# Patient Record
Sex: Male | Born: 1945 | Race: White | Hispanic: No | Smoking: Never smoker
Health system: Southern US, Community
[De-identification: ages and names within clinical notes are randomized; demographics above are authoritative.]

## PROBLEM LIST (undated history)

## (undated) DIAGNOSIS — R413 Other amnesia: Secondary | ICD-10-CM

## (undated) DIAGNOSIS — E079 Disorder of thyroid, unspecified: Secondary | ICD-10-CM

## (undated) DIAGNOSIS — Z8673 Personal history of transient ischemic attack (TIA), and cerebral infarction without residual deficits: Secondary | ICD-10-CM

## (undated) DIAGNOSIS — M109 Gout, unspecified: Secondary | ICD-10-CM

## (undated) DIAGNOSIS — E7800 Pure hypercholesterolemia, unspecified: Secondary | ICD-10-CM

## (undated) DIAGNOSIS — I10 Essential (primary) hypertension: Secondary | ICD-10-CM

## (undated) DIAGNOSIS — N401 Enlarged prostate with lower urinary tract symptoms: Secondary | ICD-10-CM

## (undated) DIAGNOSIS — I672 Cerebral atherosclerosis: Secondary | ICD-10-CM

## (undated) DIAGNOSIS — E538 Deficiency of other specified B group vitamins: Secondary | ICD-10-CM

## (undated) DIAGNOSIS — E785 Hyperlipidemia, unspecified: Secondary | ICD-10-CM

## (undated) DIAGNOSIS — E039 Hypothyroidism, unspecified: Secondary | ICD-10-CM

## (undated) HISTORY — DX: Hyperlipidemia, unspecified: E78.5

## (undated) HISTORY — PX: FRACTURE SURGERY: SHX138

## (undated) HISTORY — DX: Pure hypercholesterolemia, unspecified: E78.00

## (undated) HISTORY — DX: Hypothyroidism, unspecified: E03.9

## (undated) HISTORY — PX: ROTATOR CUFF REPAIR: SHX139

## (undated) HISTORY — DX: Cerebral atherosclerosis: I67.2

## (undated) HISTORY — DX: Cerebral atherosclerosis: Z86.73

## (undated) HISTORY — PX: HERNIA REPAIR: SHX51

## (undated) HISTORY — DX: Essential (primary) hypertension: I10

## (undated) HISTORY — DX: Gout, unspecified: M10.9

## (undated) HISTORY — DX: Benign prostatic hyperplasia with lower urinary tract symptoms: N40.1

## (undated) HISTORY — DX: Disorder of thyroid, unspecified: E07.9

## (undated) HISTORY — DX: Other amnesia: R41.3

## (undated) HISTORY — DX: Deficiency of other specified B group vitamins: E53.8

---

## 2002-12-11 ENCOUNTER — Ambulatory Visit (HOSPITAL_BASED_OUTPATIENT_CLINIC_OR_DEPARTMENT_OTHER): Admission: RE | Admit: 2002-12-11 | Discharge: 2002-12-11 | Payer: Self-pay | Admitting: Orthopedic Surgery

## 2011-12-23 ENCOUNTER — Emergency Department: Payer: Self-pay | Admitting: Emergency Medicine

## 2011-12-23 DIAGNOSIS — S2249XA Multiple fractures of ribs, unspecified side, initial encounter for closed fracture: Secondary | ICD-10-CM | POA: Diagnosis not present

## 2011-12-23 DIAGNOSIS — S2220XA Unspecified fracture of sternum, initial encounter for closed fracture: Secondary | ICD-10-CM | POA: Diagnosis not present

## 2011-12-23 LAB — CBC
HCT: 44.7 % (ref 40.0–52.0)
HGB: 15.2 g/dL (ref 13.0–18.0)
MCH: 33.9 pg (ref 26.0–34.0)
MCHC: 34 g/dL (ref 32.0–36.0)
MCV: 100 fL (ref 80–100)
Platelet: 208 10*3/uL (ref 150–440)
RBC: 4.47 10*6/uL (ref 4.40–5.90)

## 2011-12-23 LAB — COMPREHENSIVE METABOLIC PANEL
Alkaline Phosphatase: 88 U/L (ref 50–136)
Anion Gap: 14 (ref 7–16)
BUN: 23 mg/dL — ABNORMAL HIGH (ref 7–18)
Bilirubin,Total: 0.6 mg/dL (ref 0.2–1.0)
Calcium, Total: 8.6 mg/dL (ref 8.5–10.1)
Co2: 20 mmol/L — ABNORMAL LOW (ref 21–32)
EGFR (African American): 59 — ABNORMAL LOW
Glucose: 111 mg/dL — ABNORMAL HIGH (ref 65–99)
SGOT(AST): 69 U/L — ABNORMAL HIGH (ref 15–37)
Sodium: 135 mmol/L — ABNORMAL LOW (ref 136–145)

## 2011-12-23 LAB — ETHANOL
Ethanol %: 0.286 % — ABNORMAL HIGH (ref 0.000–0.080)
Ethanol: 286 mg/dL

## 2012-05-23 DIAGNOSIS — H1010 Acute atopic conjunctivitis, unspecified eye: Secondary | ICD-10-CM | POA: Diagnosis not present

## 2012-08-10 DIAGNOSIS — E039 Hypothyroidism, unspecified: Secondary | ICD-10-CM | POA: Diagnosis not present

## 2012-08-10 DIAGNOSIS — Z79899 Other long term (current) drug therapy: Secondary | ICD-10-CM | POA: Diagnosis not present

## 2012-08-10 DIAGNOSIS — I10 Essential (primary) hypertension: Secondary | ICD-10-CM | POA: Diagnosis not present

## 2012-08-10 DIAGNOSIS — Z136 Encounter for screening for cardiovascular disorders: Secondary | ICD-10-CM | POA: Diagnosis not present

## 2012-08-10 DIAGNOSIS — E7800 Pure hypercholesterolemia, unspecified: Secondary | ICD-10-CM | POA: Diagnosis not present

## 2012-08-30 DIAGNOSIS — J209 Acute bronchitis, unspecified: Secondary | ICD-10-CM | POA: Diagnosis not present

## 2012-09-03 DIAGNOSIS — I10 Essential (primary) hypertension: Secondary | ICD-10-CM | POA: Diagnosis not present

## 2012-09-03 DIAGNOSIS — R5383 Other fatigue: Secondary | ICD-10-CM | POA: Diagnosis not present

## 2012-09-03 DIAGNOSIS — J209 Acute bronchitis, unspecified: Secondary | ICD-10-CM | POA: Diagnosis not present

## 2012-09-03 DIAGNOSIS — E7800 Pure hypercholesterolemia, unspecified: Secondary | ICD-10-CM | POA: Diagnosis not present

## 2012-09-03 DIAGNOSIS — R0602 Shortness of breath: Secondary | ICD-10-CM | POA: Diagnosis not present

## 2012-11-01 DIAGNOSIS — Z0000 Encounter for general adult medical examination without abnormal findings: Secondary | ICD-10-CM | POA: Diagnosis not present

## 2012-11-01 DIAGNOSIS — I889 Nonspecific lymphadenitis, unspecified: Secondary | ICD-10-CM | POA: Diagnosis not present

## 2013-07-30 DIAGNOSIS — E7800 Pure hypercholesterolemia, unspecified: Secondary | ICD-10-CM | POA: Diagnosis not present

## 2013-07-30 DIAGNOSIS — I10 Essential (primary) hypertension: Secondary | ICD-10-CM | POA: Diagnosis not present

## 2013-07-30 DIAGNOSIS — Z79899 Other long term (current) drug therapy: Secondary | ICD-10-CM | POA: Diagnosis not present

## 2013-07-30 DIAGNOSIS — E039 Hypothyroidism, unspecified: Secondary | ICD-10-CM | POA: Diagnosis not present

## 2014-01-31 DIAGNOSIS — R109 Unspecified abdominal pain: Secondary | ICD-10-CM | POA: Diagnosis not present
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
## 2014-10-06 DIAGNOSIS — R4182 Altered mental status, unspecified: Secondary | ICD-10-CM | POA: Diagnosis not present

## 2014-10-06 DIAGNOSIS — R410 Disorientation, unspecified: Secondary | ICD-10-CM | POA: Diagnosis not present

## 2014-10-06 DIAGNOSIS — R0602 Shortness of breath: Secondary | ICD-10-CM | POA: Diagnosis not present

## 2014-10-06 DIAGNOSIS — I10 Essential (primary) hypertension: Secondary | ICD-10-CM | POA: Diagnosis not present

## 2014-10-06 DIAGNOSIS — E780 Pure hypercholesterolemia: Secondary | ICD-10-CM | POA: Diagnosis not present

## 2014-10-06 DIAGNOSIS — I639 Cerebral infarction, unspecified: Secondary | ICD-10-CM | POA: Diagnosis not present

## 2014-10-07 DIAGNOSIS — E780 Pure hypercholesterolemia: Secondary | ICD-10-CM | POA: Diagnosis not present

## 2014-10-07 DIAGNOSIS — Z0000 Encounter for general adult medical examination without abnormal findings: Secondary | ICD-10-CM | POA: Diagnosis not present

## 2014-10-07 DIAGNOSIS — I10 Essential (primary) hypertension: Secondary | ICD-10-CM | POA: Diagnosis not present

## 2014-10-07 DIAGNOSIS — Z79899 Other long term (current) drug therapy: Secondary | ICD-10-CM | POA: Diagnosis not present

## 2014-10-07 DIAGNOSIS — E039 Hypothyroidism, unspecified: Secondary | ICD-10-CM | POA: Diagnosis not present

## 2014-10-07 DIAGNOSIS — I639 Cerebral infarction, unspecified: Secondary | ICD-10-CM | POA: Diagnosis not present

## 2014-10-13 DIAGNOSIS — I639 Cerebral infarction, unspecified: Secondary | ICD-10-CM | POA: Diagnosis not present

## 2014-10-13 DIAGNOSIS — I6523 Occlusion and stenosis of bilateral carotid arteries: Secondary | ICD-10-CM | POA: Diagnosis not present

## 2014-10-15 DIAGNOSIS — I361 Nonrheumatic tricuspid (valve) insufficiency: Secondary | ICD-10-CM | POA: Diagnosis not present

## 2014-10-15 DIAGNOSIS — I639 Cerebral infarction, unspecified: Secondary | ICD-10-CM | POA: Diagnosis not present

## 2014-10-15 DIAGNOSIS — I350 Nonrheumatic aortic (valve) stenosis: Secondary | ICD-10-CM | POA: Diagnosis not present

## 2014-10-17 DIAGNOSIS — R413 Other amnesia: Secondary | ICD-10-CM | POA: Diagnosis not present

## 2014-10-17 DIAGNOSIS — I639 Cerebral infarction, unspecified: Secondary | ICD-10-CM | POA: Diagnosis not present

## 2014-10-17 DIAGNOSIS — E039 Hypothyroidism, unspecified: Secondary | ICD-10-CM | POA: Diagnosis not present

## 2014-10-17 DIAGNOSIS — E780 Pure hypercholesterolemia: Secondary | ICD-10-CM | POA: Diagnosis not present

## 2014-10-17 DIAGNOSIS — Z23 Encounter for immunization: Secondary | ICD-10-CM | POA: Diagnosis not present

## 2014-11-07 DIAGNOSIS — I69390 Apraxia following cerebral infarction: Secondary | ICD-10-CM | POA: Diagnosis not present

## 2014-11-07 DIAGNOSIS — R5381 Other malaise: Secondary | ICD-10-CM | POA: Diagnosis not present

## 2014-11-07 DIAGNOSIS — M6281 Muscle weakness (generalized): Secondary | ICD-10-CM | POA: Diagnosis not present

## 2014-11-07 DIAGNOSIS — I69393 Ataxia following cerebral infarction: Secondary | ICD-10-CM | POA: Diagnosis not present

## 2014-11-07 DIAGNOSIS — R5383 Other fatigue: Secondary | ICD-10-CM | POA: Diagnosis not present

## 2014-11-12 DIAGNOSIS — M6281 Muscle weakness (generalized): Secondary | ICD-10-CM | POA: Diagnosis not present

## 2014-11-12 DIAGNOSIS — R5383 Other fatigue: Secondary | ICD-10-CM | POA: Diagnosis not present

## 2014-11-12 DIAGNOSIS — I69393 Ataxia following cerebral infarction: Secondary | ICD-10-CM | POA: Diagnosis not present

## 2014-11-12 DIAGNOSIS — R5381 Other malaise: Secondary | ICD-10-CM | POA: Diagnosis not present

## 2014-11-12 DIAGNOSIS — I69390 Apraxia following cerebral infarction: Secondary | ICD-10-CM | POA: Diagnosis not present

## 2015-01-22 DIAGNOSIS — I639 Cerebral infarction, unspecified: Secondary | ICD-10-CM

## 2015-01-22 HISTORY — DX: Cerebral infarction, unspecified: I63.9

## 2015-03-26 DIAGNOSIS — I639 Cerebral infarction, unspecified: Secondary | ICD-10-CM | POA: Diagnosis not present

## 2015-03-26 DIAGNOSIS — R2681 Unsteadiness on feet: Secondary | ICD-10-CM | POA: Diagnosis not present

## 2015-04-02 DIAGNOSIS — I639 Cerebral infarction, unspecified: Secondary | ICD-10-CM | POA: Diagnosis not present

## 2015-04-14 DIAGNOSIS — I6340 Cerebral infarction due to embolism of unspecified cerebral artery: Secondary | ICD-10-CM | POA: Diagnosis not present

## 2015-04-14 DIAGNOSIS — H6122 Impacted cerumen, left ear: Secondary | ICD-10-CM | POA: Diagnosis not present

## 2015-04-14 DIAGNOSIS — Z23 Encounter for immunization: Secondary | ICD-10-CM | POA: Diagnosis not present

## 2015-04-14 DIAGNOSIS — I10 Essential (primary) hypertension: Secondary | ICD-10-CM | POA: Diagnosis not present

## 2015-10-26 DIAGNOSIS — I6340 Cerebral infarction due to embolism of unspecified cerebral artery: Secondary | ICD-10-CM | POA: Diagnosis not present

## 2015-10-26 DIAGNOSIS — Z79899 Other long term (current) drug therapy: Secondary | ICD-10-CM | POA: Diagnosis not present

## 2015-10-26 DIAGNOSIS — R413 Other amnesia: Secondary | ICD-10-CM | POA: Diagnosis not present

## 2015-10-26 DIAGNOSIS — I10 Essential (primary) hypertension: Secondary | ICD-10-CM | POA: Diagnosis not present

## 2015-10-26 DIAGNOSIS — E039 Hypothyroidism, unspecified: Secondary | ICD-10-CM | POA: Diagnosis not present

## 2015-10-26 DIAGNOSIS — E785 Hyperlipidemia, unspecified: Secondary | ICD-10-CM | POA: Diagnosis not present

## 2016-03-21 DIAGNOSIS — I6340 Cerebral infarction due to embolism of unspecified cerebral artery: Secondary | ICD-10-CM | POA: Diagnosis not present

## 2016-03-21 DIAGNOSIS — E039 Hypothyroidism, unspecified: Secondary | ICD-10-CM | POA: Diagnosis not present

## 2016-03-21 DIAGNOSIS — E785 Hyperlipidemia, unspecified: Secondary | ICD-10-CM | POA: Diagnosis not present

## 2016-03-21 DIAGNOSIS — Z0000 Encounter for general adult medical examination without abnormal findings: Secondary | ICD-10-CM | POA: Diagnosis not present

## 2016-03-21 DIAGNOSIS — Z79899 Other long term (current) drug therapy: Secondary | ICD-10-CM | POA: Diagnosis not present

## 2016-03-21 DIAGNOSIS — I10 Essential (primary) hypertension: Secondary | ICD-10-CM | POA: Diagnosis not present

## 2016-03-28 DIAGNOSIS — D510 Vitamin B12 deficiency anemia due to intrinsic factor deficiency: Secondary | ICD-10-CM | POA: Diagnosis not present

## 2016-04-07 DIAGNOSIS — D519 Vitamin B12 deficiency anemia, unspecified: Secondary | ICD-10-CM | POA: Diagnosis not present

## 2016-04-15 DIAGNOSIS — E538 Deficiency of other specified B group vitamins: Secondary | ICD-10-CM | POA: Diagnosis not present

## 2016-04-21 DIAGNOSIS — E538 Deficiency of other specified B group vitamins: Secondary | ICD-10-CM | POA: Diagnosis not present

## 2016-11-18 DIAGNOSIS — Z6828 Body mass index (BMI) 28.0-28.9, adult: Secondary | ICD-10-CM | POA: Diagnosis not present

## 2016-11-18 DIAGNOSIS — E538 Deficiency of other specified B group vitamins: Secondary | ICD-10-CM | POA: Diagnosis not present

## 2016-11-18 DIAGNOSIS — E039 Hypothyroidism, unspecified: Secondary | ICD-10-CM | POA: Diagnosis not present

## 2016-11-18 DIAGNOSIS — Z79899 Other long term (current) drug therapy: Secondary | ICD-10-CM | POA: Diagnosis not present

## 2016-11-25 DIAGNOSIS — D510 Vitamin B12 deficiency anemia due to intrinsic factor deficiency: Secondary | ICD-10-CM | POA: Diagnosis not present

## 2016-12-07 DIAGNOSIS — D510 Vitamin B12 deficiency anemia due to intrinsic factor deficiency: Secondary | ICD-10-CM | POA: Diagnosis not present

## 2016-12-26 DIAGNOSIS — D510 Vitamin B12 deficiency anemia due to intrinsic factor deficiency: Secondary | ICD-10-CM | POA: Diagnosis not present

## 2017-01-05 DIAGNOSIS — D510 Vitamin B12 deficiency anemia due to intrinsic factor deficiency: Secondary | ICD-10-CM | POA: Diagnosis not present

## 2017-01-17 DIAGNOSIS — D510 Vitamin B12 deficiency anemia due to intrinsic factor deficiency: Secondary | ICD-10-CM | POA: Diagnosis not present

## 2017-01-27 DIAGNOSIS — E538 Deficiency of other specified B group vitamins: Secondary | ICD-10-CM | POA: Diagnosis not present

## 2017-02-03 DIAGNOSIS — D510 Vitamin B12 deficiency anemia due to intrinsic factor deficiency: Secondary | ICD-10-CM | POA: Diagnosis not present

## 2017-03-02 DIAGNOSIS — E538 Deficiency of other specified B group vitamins: Secondary | ICD-10-CM | POA: Diagnosis not present

## 2017-03-15 DIAGNOSIS — R0602 Shortness of breath: Secondary | ICD-10-CM | POA: Diagnosis not present

## 2017-03-15 DIAGNOSIS — E538 Deficiency of other specified B group vitamins: Secondary | ICD-10-CM | POA: Diagnosis not present

## 2017-03-15 DIAGNOSIS — Z6828 Body mass index (BMI) 28.0-28.9, adult: Secondary | ICD-10-CM | POA: Diagnosis not present

## 2017-07-24 DIAGNOSIS — Z0000 Encounter for general adult medical examination without abnormal findings: Secondary | ICD-10-CM | POA: Diagnosis not present

## 2017-07-24 DIAGNOSIS — Z23 Encounter for immunization: Secondary | ICD-10-CM | POA: Diagnosis not present

## 2017-07-24 DIAGNOSIS — E538 Deficiency of other specified B group vitamins: Secondary | ICD-10-CM | POA: Diagnosis not present

## 2017-07-24 DIAGNOSIS — E785 Hyperlipidemia, unspecified: Secondary | ICD-10-CM | POA: Diagnosis not present

## 2017-07-24 DIAGNOSIS — Z79899 Other long term (current) drug therapy: Secondary | ICD-10-CM | POA: Diagnosis not present

## 2017-07-24 DIAGNOSIS — E039 Hypothyroidism, unspecified: Secondary | ICD-10-CM | POA: Diagnosis not present

## 2017-07-24 DIAGNOSIS — I10 Essential (primary) hypertension: Secondary | ICD-10-CM | POA: Diagnosis not present

## 2017-07-24 DIAGNOSIS — M545 Low back pain: Secondary | ICD-10-CM | POA: Diagnosis not present

## 2017-08-11 DIAGNOSIS — E538 Deficiency of other specified B group vitamins: Secondary | ICD-10-CM | POA: Diagnosis not present

## 2017-08-22 DIAGNOSIS — D510 Vitamin B12 deficiency anemia due to intrinsic factor deficiency: Secondary | ICD-10-CM | POA: Diagnosis not present

## 2017-09-07 DIAGNOSIS — D510 Vitamin B12 deficiency anemia due to intrinsic factor deficiency: Secondary | ICD-10-CM | POA: Diagnosis not present

## 2017-09-19 DIAGNOSIS — D510 Vitamin B12 deficiency anemia due to intrinsic factor deficiency: Secondary | ICD-10-CM | POA: Diagnosis not present

## 2017-10-11 DIAGNOSIS — E538 Deficiency of other specified B group vitamins: Secondary | ICD-10-CM | POA: Diagnosis not present

## 2018-04-27 DIAGNOSIS — I672 Cerebral atherosclerosis: Secondary | ICD-10-CM | POA: Diagnosis not present

## 2018-04-27 DIAGNOSIS — I10 Essential (primary) hypertension: Secondary | ICD-10-CM | POA: Diagnosis not present

## 2018-04-27 DIAGNOSIS — R5381 Other malaise: Secondary | ICD-10-CM | POA: Diagnosis not present

## 2018-04-27 DIAGNOSIS — Z23 Encounter for immunization: Secondary | ICD-10-CM | POA: Diagnosis not present

## 2018-04-27 DIAGNOSIS — R413 Other amnesia: Secondary | ICD-10-CM | POA: Diagnosis not present

## 2018-09-06 DIAGNOSIS — E7800 Pure hypercholesterolemia, unspecified: Secondary | ICD-10-CM | POA: Diagnosis not present

## 2018-09-06 DIAGNOSIS — Z23 Encounter for immunization: Secondary | ICD-10-CM | POA: Diagnosis not present

## 2018-09-06 DIAGNOSIS — E039 Hypothyroidism, unspecified: Secondary | ICD-10-CM | POA: Diagnosis not present

## 2018-09-06 DIAGNOSIS — Z79899 Other long term (current) drug therapy: Secondary | ICD-10-CM | POA: Diagnosis not present

## 2018-09-06 DIAGNOSIS — M109 Gout, unspecified: Secondary | ICD-10-CM | POA: Diagnosis not present

## 2018-09-06 DIAGNOSIS — E785 Hyperlipidemia, unspecified: Secondary | ICD-10-CM | POA: Diagnosis not present

## 2018-09-06 DIAGNOSIS — Z0000 Encounter for general adult medical examination without abnormal findings: Secondary | ICD-10-CM | POA: Diagnosis not present

## 2018-09-06 DIAGNOSIS — I10 Essential (primary) hypertension: Secondary | ICD-10-CM | POA: Diagnosis not present

## 2019-03-07 DIAGNOSIS — Z6829 Body mass index (BMI) 29.0-29.9, adult: Secondary | ICD-10-CM | POA: Diagnosis not present

## 2019-03-07 DIAGNOSIS — H6120 Impacted cerumen, unspecified ear: Secondary | ICD-10-CM | POA: Diagnosis not present

## 2019-03-07 DIAGNOSIS — H6092 Unspecified otitis externa, left ear: Secondary | ICD-10-CM | POA: Diagnosis not present

## 2019-10-08 DIAGNOSIS — Z0000 Encounter for general adult medical examination without abnormal findings: Secondary | ICD-10-CM | POA: Diagnosis not present

## 2019-10-08 DIAGNOSIS — I10 Essential (primary) hypertension: Secondary | ICD-10-CM | POA: Diagnosis not present

## 2019-10-08 DIAGNOSIS — E039 Hypothyroidism, unspecified: Secondary | ICD-10-CM | POA: Diagnosis not present

## 2019-10-08 DIAGNOSIS — Z79899 Other long term (current) drug therapy: Secondary | ICD-10-CM | POA: Diagnosis not present

## 2019-10-08 DIAGNOSIS — E7800 Pure hypercholesterolemia, unspecified: Secondary | ICD-10-CM | POA: Diagnosis not present

## 2019-10-08 DIAGNOSIS — E538 Deficiency of other specified B group vitamins: Secondary | ICD-10-CM | POA: Diagnosis not present

## 2020-05-21 DIAGNOSIS — Z131 Encounter for screening for diabetes mellitus: Secondary | ICD-10-CM | POA: Diagnosis not present

## 2020-05-21 DIAGNOSIS — N401 Enlarged prostate with lower urinary tract symptoms: Secondary | ICD-10-CM | POA: Diagnosis not present

## 2020-05-21 DIAGNOSIS — Z79899 Other long term (current) drug therapy: Secondary | ICD-10-CM | POA: Diagnosis not present

## 2020-05-21 DIAGNOSIS — N481 Balanitis: Secondary | ICD-10-CM | POA: Diagnosis not present

## 2020-07-17 DIAGNOSIS — Z6827 Body mass index (BMI) 27.0-27.9, adult: Secondary | ICD-10-CM | POA: Diagnosis not present

## 2020-07-17 DIAGNOSIS — N401 Enlarged prostate with lower urinary tract symptoms: Secondary | ICD-10-CM | POA: Diagnosis not present

## 2020-07-17 DIAGNOSIS — Z23 Encounter for immunization: Secondary | ICD-10-CM | POA: Diagnosis not present

## 2020-10-07 DIAGNOSIS — I10 Essential (primary) hypertension: Secondary | ICD-10-CM | POA: Diagnosis not present

## 2020-10-07 DIAGNOSIS — E039 Hypothyroidism, unspecified: Secondary | ICD-10-CM | POA: Diagnosis not present

## 2020-10-07 DIAGNOSIS — E559 Vitamin D deficiency, unspecified: Secondary | ICD-10-CM | POA: Diagnosis not present

## 2020-10-09 DIAGNOSIS — I10 Essential (primary) hypertension: Secondary | ICD-10-CM | POA: Diagnosis not present

## 2020-10-09 DIAGNOSIS — R0600 Dyspnea, unspecified: Secondary | ICD-10-CM | POA: Diagnosis not present

## 2020-10-09 DIAGNOSIS — E7800 Pure hypercholesterolemia, unspecified: Secondary | ICD-10-CM | POA: Diagnosis not present

## 2020-10-09 DIAGNOSIS — E039 Hypothyroidism, unspecified: Secondary | ICD-10-CM | POA: Diagnosis not present

## 2020-11-07 DIAGNOSIS — I10 Essential (primary) hypertension: Secondary | ICD-10-CM | POA: Diagnosis not present

## 2020-11-07 DIAGNOSIS — E039 Hypothyroidism, unspecified: Secondary | ICD-10-CM | POA: Diagnosis not present

## 2020-12-03 ENCOUNTER — Encounter: Payer: Self-pay | Admitting: Cardiology

## 2020-12-03 ENCOUNTER — Encounter: Payer: Self-pay | Admitting: *Deleted

## 2020-12-07 DIAGNOSIS — E7800 Pure hypercholesterolemia, unspecified: Secondary | ICD-10-CM | POA: Diagnosis not present

## 2020-12-07 DIAGNOSIS — R413 Other amnesia: Secondary | ICD-10-CM | POA: Diagnosis not present

## 2021-01-01 DIAGNOSIS — I639 Cerebral infarction, unspecified: Secondary | ICD-10-CM | POA: Diagnosis not present

## 2021-01-01 DIAGNOSIS — Z6826 Body mass index (BMI) 26.0-26.9, adult: Secondary | ICD-10-CM | POA: Diagnosis not present

## 2021-01-01 DIAGNOSIS — S161XXA Strain of muscle, fascia and tendon at neck level, initial encounter: Secondary | ICD-10-CM | POA: Diagnosis not present

## 2021-01-01 DIAGNOSIS — F0630 Mood disorder due to known physiological condition, unspecified: Secondary | ICD-10-CM | POA: Diagnosis not present

## 2021-01-06 ENCOUNTER — Encounter: Payer: Self-pay | Admitting: Cardiology

## 2021-01-06 ENCOUNTER — Other Ambulatory Visit: Payer: Self-pay

## 2021-01-06 ENCOUNTER — Ambulatory Visit: Payer: Medicare Other | Admitting: Cardiology

## 2021-01-06 VITALS — BP 126/70 | HR 74 | Ht 65.0 in | Wt 160.0 lb

## 2021-01-06 DIAGNOSIS — Z8673 Personal history of transient ischemic attack (TIA), and cerebral infarction without residual deficits: Secondary | ICD-10-CM

## 2021-01-06 DIAGNOSIS — R0600 Dyspnea, unspecified: Secondary | ICD-10-CM

## 2021-01-06 DIAGNOSIS — E782 Mixed hyperlipidemia: Secondary | ICD-10-CM

## 2021-01-06 DIAGNOSIS — I10 Essential (primary) hypertension: Secondary | ICD-10-CM | POA: Insufficient documentation

## 2021-01-06 DIAGNOSIS — R0609 Other forms of dyspnea: Secondary | ICD-10-CM | POA: Insufficient documentation

## 2021-01-06 NOTE — Progress Notes (Signed)
Cardiology Office Note:    Date:  01/06/2021   ID:  Andrew Cain, DOB [id number], MRN [id number]  PCP:  Mateo Flow, MD  Cardiologist:  Jenean Lindau, MD   Referring MD: Mateo Flow, MD    ASSESSMENT:    1. DOE (dyspnea on exertion)   2. Essential hypertension   3. Mixed dyslipidemia   4. History of stroke    PLAN:    In order of problems listed above:  Primary prevention stressed with the patient.  Importance of compliance with diet medication stressed any vocalized understanding.  I told him to be ambulating to the best of his ability. Dyspnea on exertion: Cardiovascular evaluation needs to be done.  He is fairly sedentary because of stroke and we will do a Lexiscan sestamibi to see if there is any coronary etiology for the symptoms.  Echocardiogram will be done to assess murmur heard on auscultation. Cardiac murmur: Echocardiogram will be done to assess murmur heard on auscultation. Mixed dyslipidemia: Lipids reviewed.  Diet advised.  Lipids followed by primary care. Essential hypertension: Blood pressure stable and diet was emphasized. Patient will be seen in follow-up appointment in 6 months or earlier if the patient has any concerns    Medication Adjustments/Labs and Tests Ordered: Current medicines are reviewed at length with the patient today.  Concerns regarding medicines are outlined above.  Orders Placed This Encounter  Procedures   MYOCARDIAL PERFUSION IMAGING   EKG 12-Lead   ECHOCARDIOGRAM COMPLETE   No orders of the defined types were placed in this encounter.    History of Present Illness:    Andrew Cain is a 75 y.o. male who is being seen today for the evaluation of dyspnea on exertion at the request of Mateo Flow, MD. patient is a pleasant 75 year old male.  He has past medical history of essential hypertension dyslipidemia and history of stroke.  He mentioned to his doctor that he has dyspnea on exertion and wanted a cardiac evaluation.   Since stroke he has had a sedentary lifestyle.  No chest pain orthopnea or PND.  At the time of my evaluation, the patient is alert awake oriented and in no distress.  His daughter accompanies him for this visit and tells me that she feels her dad also has some short-term memory issues.  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   amLODipine (NORVASC) 5 MG tablet Take 5 mg by mouth daily.   aspirin EC 325 MG tablet Take 325 mg by mouth daily.   atorvastatin (LIPITOR) 20 MG tablet Take 20 mg by mouth daily.   cyanocobalamin (,VITAMIN B-12,) 1000 MCG/ML injection Inject 1,000 mcg into the muscle every 30 (thirty) days.   cyclobenzaprine (FLEXERIL) 5 MG tablet Take 5 mg by mouth  2 (two) times daily.   finasteride (PROSCAR) 5 MG tablet Take 5 mg by mouth daily.   FLUoxetine (PROZAC) 20 MG capsule Take 20 mg by mouth daily.   levothyroxine (SYNTHROID) 125 MCG tablet Take 125 mcg by mouth daily before breakfast.   losartan (COZAAR) 50 MG tablet Take 50 mg by mouth daily.   tamsulosin (FLOMAX) 0.4 MG CAPS capsule Take 0.4 mg by mouth at bedtime.     Allergies:   Patient has no known allergies.   Social History   Socioeconomic History   Marital status: Divorced    Spouse name: Not on file   Number of children: Not on file   Years of education: Not on file   Highest education level:  Not on file  Occupational History   Not on file  Tobacco Use   Smoking status: Never   Smokeless tobacco: Never  Substance and Sexual Activity   Alcohol use: Never   Drug use: Never   Sexual activity: Not on file  Other Topics Concern   Not on file  Social History Narrative   Not on file   Social Determinants of Health   Financial Resource Strain: Not on file  Food Insecurity: Not on file  Transportation Needs: Not on file  Physical Activity: Not on file  Stress: Not on file  Social Connections: Not on file     Family History: The patient's family history includes Aneurysm in his father; Hypercholesterolemia in his sister; Hypertension in his sister; Liver disease in his son; Other in his sister; Prostate cancer in his brother and brother; Renal Disease in his brother.  ROS:   Please see the history of present illness.    All other systems reviewed and are negative.  EKGs/Labs/Other Studies Reviewed:    The following studies were reviewed today: EKG reveals sinus rhythm and nonspecific ST-T changes   Recent Labs: No results found for requested labs within last 8760 hours.  Recent Lipid Panel No results found for: CHOL, TRIG, HDL, CHOLHDL, VLDL, LDLCALC, LDLDIRECT  Physical Exam:    VS:  BP 126/70   Pulse 74   Ht 5' 5" (1.651 m)   Wt 160 lb (72.6 kg)   SpO2 94%   BMI 26.63 kg/m     Wt Readings from Last 3 Encounters:  01/06/21 160 lb (72.6 kg)  10/09/20 163 lb (73.9 kg)     GEN: Patient is in no acute distress HEENT: Normal NECK: No JVD; No carotid bruits LYMPHATICS: No lymphadenopathy CARDIAC: S1 S2 regular, 2/6 systolic murmur at the apex. RESPIRATORY:  Clear to auscultation without rales, wheezing or rhonchi  ABDOMEN: Soft, non-tender, non-distended MUSCULOSKELETAL:  No edema; No deformity  SKIN: Warm and dry NEUROLOGIC:  Alert and oriented x 3 PSYCHIATRIC:  Normal affect    Signed, Jenean Lindau, MD  01/06/2021 11:12 AM    [place]

## 2021-01-06 NOTE — Patient Instructions (Signed)
Medication Instructions:  No medication changes. *If you need a refill on your cardiac medications before your next appointment, please call your pharmacy*   Lab Work: None ordered If you have labs (blood work) drawn today and your tests are completely normal, you will receive your results only by: [place] (if you have MyChart) OR A paper copy in the mail If you have any lab test that is abnormal or we need to change your treatment, we will call you to review the results.   Testing/Procedures: Your physician has requested that you have a lexiscan myoview. For further information please visit [email or web address]. Please follow instruction sheet, as given.  The test will take approximately 3 to 4 hours to complete; you may bring reading material.  If someone comes with you to your appointment, they will need to remain in the main lobby due to limited space in the testing area.   How to prepare for your Myocardial Perfusion Test: Do not eat or drink 3 hours prior to your test, except you may have water. Do not consume products containing caffeine (regular or decaffeinated) 12 hours prior to your test. (ex: coffee, chocolate, sodas, tea). Do bring a list of your current medications with you.  If not listed below, you may take your medications as normal. Do wear comfortable clothes (no dresses or overalls) and walking shoes, tennis shoes preferred (No heels or open toe shoes are allowed). Do NOT wear cologne, perfume, aftershave, or lotions (deodorant is allowed). If these instructions are not followed, your test will have to be rescheduled.  Your physician has requested that you have an echocardiogram. Echocardiography is a painless test that uses sound waves to create images of your heart. It provides your doctor with information about the size and shape of your heart and how well your heart's chambers and valves are working. This procedure takes approximately one hour. There are no  restrictions for this procedure.   Follow-Up: At [facility], you and your health needs are our priority.  As part of our continuing mission to provide you with exceptional heart care, we have created designated Provider Care Teams.  These Care Teams include your primary Cardiologist (physician) and Advanced Practice Providers (APPs -  Physician Assistants and Nurse Practitioners) who all work together to provide you with the care you need, when you need it.  We recommend signing up for the patient portal called "MyChart".  Sign up information is provided on this After Visit Summary.  MyChart is used to connect with patients for Virtual Visits (Telemedicine).  Patients are able to view lab/test results, encounter notes, upcoming appointments, etc.  Non-urgent messages can be sent to your provider as well.   To learn more about what you can do with MyChart, go to [email or web address].    Your next appointment:   6 month(s)  The format for your next appointment:   In Person  Provider:   Jyl Heinz, MD   Other Instructions Cardiac Nuclear Scan A cardiac nuclear scan is a test that is done to check the flow of blood to your heart. It is done when you are resting and when you are exercising. The test looks for problems such as: Not enough blood reaching a portion of the heart. The heart muscle not working as it should. You may need this test if: You have heart disease. You have had lab results that are not normal. You have had heart surgery or a balloon procedure to open up blocked arteries (  angioplasty). You have chest pain. You have shortness of breath. In this test, a special dye (tracer) is put into your bloodstream. The tracer will travel to your heart. A camera will then take pictures of your heart to see how the tracer moves through your heart. This test is usually done at a hospital and takes 2-4 hours. Tell a doctor about: Any allergies you have. All medicines you are  taking, including vitamins, herbs, eye drops, creams, and over-the-counter medicines. Any problems you or family members have had with anesthetic medicines. Any blood disorders you have. Any surgeries you have had. Any medical conditions you have. Whether you are pregnant or may be pregnant. What are the risks? Generally, this is a safe test. However, problems may occur, such as: Serious chest pain and heart attack. This is only a risk if the stress portion of the test is done. Rapid heartbeat. A feeling of warmth in your chest. This feeling usually does not last long. Allergic reaction to the tracer. What happens before the test? Ask your doctor about changing or stopping your normal medicines. This is important. Follow instructions from your doctor about what you cannot eat or drink. Remove your jewelry on the day of the test. What happens during the test? An IV tube will be inserted into one of your veins. Your doctor will give you a small amount of tracer through the IV tube. You will wait for 20-40 minutes while the tracer moves through your bloodstream. Your heart will be monitored with an electrocardiogram (ECG). You will lie down on an exam table. Pictures of your heart will be taken for about 15-20 minutes. You may also have a stress test. For this test, one of these things may be done: You will be asked to exercise on a treadmill or a stationary bike. You will be given medicines that will make your heart work harder. This is done if you are unable to exercise. When blood flow to your heart has peaked, a tracer will again be given through the IV tube. After 20-40 minutes, you will get back on the exam table. More pictures will be taken of your heart. Depending on the tracer that is used, more pictures may need to be taken 3-4 hours later. Your IV tube will be removed when the test is over. The test may vary among doctors and hospitals. What happens after the test? Ask your  doctor: Whether you can return to your normal schedule, including diet, activities, and medicines. Whether you should drink more fluids. This will help to remove the tracer from your body. Drink enough fluid to keep your pee (urine) pale yellow. Ask your doctor, or the department that is doing the test: When will my results be ready? How will I get my results? Summary A cardiac nuclear scan is a test that is done to check the flow of blood to your heart. Tell your doctor whether you are pregnant or may be pregnant. Before the test, ask your doctor about changing or stopping your normal medicines. This is important. Ask your doctor whether you can return to your normal activities. You may be asked to drink more fluids. This information is not intended to replace advice given to you by your health care provider. Make sure you discuss any questions you have with your health care provider. Document Revised: 10/17/2018 Document Reviewed: 12/11/2017 Elsevier Patient Education  2021 [employer].    Echocardiogram An echocardiogram is a test that uses sound waves (ultrasound)  to produce images of the heart. Images from an echocardiogram can provide important information about: Heart size and shape. The size and thickness and movement of your heart's walls. Heart muscle function and strength. Heart valve function or if you have stenosis. Stenosis is when the heart valves are too narrow. If blood is flowing backward through the heart valves (regurgitation). A tumor or infectious growth around the heart valves. Areas of heart muscle that are not working well because of poor blood flow or injury from a heart attack. Aneurysm detection. An aneurysm is a weak or damaged part of an artery wall. The wall bulges out from the normal force of blood pumping through the body. Tell a health care provider about: Any allergies you have. All medicines you are taking, including vitamins, herbs, eye drops,  creams, and over-the-counter medicines. Any blood disorders you have. Any surgeries you have had. Any medical conditions you have. Whether you are pregnant or may be pregnant. What are the risks? Generally, this is a safe test. However, problems may occur, including an allergic reaction to dye (contrast) that may be used during the test. What happens before the test? No specific preparation is needed. You may eat and drink normally. What happens during the test? You will take off your clothes from the waist up and put on a hospital gown. Electrodes or electrocardiogram (ECG)patches may be placed on your chest. The electrodes or patches are then connected to a device that monitors your heart rate and rhythm. You will lie down on a table for an ultrasound exam. A gel will be applied to your chest to help sound waves pass through your skin. A handheld device, called a transducer, will be pressed against your chest and moved over your heart. The transducer produces sound waves that travel to your heart and bounce back (or "echo" back) to the transducer. These sound waves will be captured in real-time and changed into images of your heart that can be viewed on a video monitor. The images will be recorded on a computer and reviewed by your health care provider. You may be asked to change positions or hold your breath for a short time. This makes it easier to get different views or better views of your heart. In some cases, you may receive contrast through an IV in one of your veins. This can improve the quality of the pictures from your heart. The procedure may vary among health care providers and hospitals.    What can I expect after the test? You may return to your normal, everyday life, including diet, activities, and medicines, unless your health care provider tells you not to do that. Follow these instructions at home: It is up to you to get the results of your test. Ask your health care  provider, or the department that is doing the test, when your results will be ready. Keep all follow-up visits. This is important. Summary An echocardiogram is a test that uses sound waves (ultrasound) to produce images of the heart. Images from an echocardiogram can provide important information about the size and shape of your heart, heart muscle function, heart valve function, and other possible heart problems. You do not need to do anything to prepare before this test. You may eat and drink normally. After the echocardiogram is completed, you may return to your normal, everyday life, unless your health care provider tells you not to do that. This information is not intended to replace advice given to you by  your health care provider. Make sure you discuss any questions you have with your health care provider. Document Revised: 02/18/2020 Document Reviewed: 02/18/2020 Elsevier Patient Education  2021 [employer].

## 2021-01-07 DIAGNOSIS — I10 Essential (primary) hypertension: Secondary | ICD-10-CM | POA: Diagnosis not present

## 2021-01-07 DIAGNOSIS — E539 Vitamin B deficiency, unspecified: Secondary | ICD-10-CM | POA: Diagnosis not present

## 2021-01-07 DIAGNOSIS — E039 Hypothyroidism, unspecified: Secondary | ICD-10-CM | POA: Diagnosis not present

## 2021-01-21 ENCOUNTER — Telehealth: Payer: Self-pay

## 2021-01-21 NOTE — Telephone Encounter (Signed)
Spoke with the patient's daughter, detailed instructions given. She will be bringing him. Asked to call back with any questions. S.Bryony Kaman EMTP

## 2021-01-26 ENCOUNTER — Ambulatory Visit (INDEPENDENT_AMBULATORY_CARE_PROVIDER_SITE_OTHER): Payer: Medicare Other

## 2021-01-26 ENCOUNTER — Other Ambulatory Visit: Payer: Self-pay

## 2021-01-26 DIAGNOSIS — R0609 Other forms of dyspnea: Secondary | ICD-10-CM

## 2021-01-26 DIAGNOSIS — R0600 Dyspnea, unspecified: Secondary | ICD-10-CM

## 2021-01-26 LAB — ECHOCARDIOGRAM COMPLETE
Area-P 1/2: 3.63 cm2
S' Lateral: 2.6 cm

## 2021-01-26 MED ORDER — REGADENOSON 0.4 MG/5ML IV SOLN
0.4000 mg | Freq: Once | INTRAVENOUS | Status: AC
  Administered 2021-01-26: 0.4 mg via INTRAVENOUS

## 2021-01-26 MED ORDER — TECHNETIUM TC 99M TETROFOSMIN IV KIT
11.0000 | PACK | Freq: Once | INTRAVENOUS | Status: AC | PRN
  Administered 2021-01-26: 11 via INTRAVENOUS

## 2021-01-26 MED ORDER — TECHNETIUM TC 99M TETROFOSMIN IV KIT
32.3000 | PACK | Freq: Once | INTRAVENOUS | Status: AC | PRN
  Administered 2021-01-26: 32.3 via INTRAVENOUS

## 2021-01-27 LAB — MYOCARDIAL PERFUSION IMAGING
LV dias vol: 68 mL (ref 62–150)
LV sys vol: 23 mL
Peak HR: 93 {beats}/min
Rest HR: 77 {beats}/min
SDS: 5
SRS: 0
SSS: 5
TID: 1.2

## 2021-02-16 DIAGNOSIS — Z20828 Contact with and (suspected) exposure to other viral communicable diseases: Secondary | ICD-10-CM | POA: Diagnosis not present

## 2021-02-16 DIAGNOSIS — J22 Unspecified acute lower respiratory infection: Secondary | ICD-10-CM | POA: Diagnosis not present

## 2021-02-16 DIAGNOSIS — R0602 Shortness of breath: Secondary | ICD-10-CM | POA: Diagnosis not present

## 2021-02-16 DIAGNOSIS — R059 Cough, unspecified: Secondary | ICD-10-CM | POA: Diagnosis not present

## 2021-03-04 DIAGNOSIS — Z6826 Body mass index (BMI) 26.0-26.9, adult: Secondary | ICD-10-CM | POA: Diagnosis not present

## 2021-03-04 DIAGNOSIS — R0902 Hypoxemia: Secondary | ICD-10-CM | POA: Diagnosis not present

## 2021-03-04 DIAGNOSIS — Z1331 Encounter for screening for depression: Secondary | ICD-10-CM | POA: Diagnosis not present

## 2021-03-04 DIAGNOSIS — J849 Interstitial pulmonary disease, unspecified: Secondary | ICD-10-CM | POA: Diagnosis not present

## 2021-03-05 DIAGNOSIS — J849 Interstitial pulmonary disease, unspecified: Secondary | ICD-10-CM | POA: Diagnosis not present

## 2021-03-08 DIAGNOSIS — J849 Interstitial pulmonary disease, unspecified: Secondary | ICD-10-CM | POA: Diagnosis not present

## 2021-03-17 DIAGNOSIS — J849 Interstitial pulmonary disease, unspecified: Secondary | ICD-10-CM | POA: Diagnosis not present

## 2021-03-17 DIAGNOSIS — I951 Orthostatic hypotension: Secondary | ICD-10-CM | POA: Diagnosis not present

## 2021-04-05 DIAGNOSIS — J432 Centrilobular emphysema: Secondary | ICD-10-CM | POA: Diagnosis not present

## 2021-04-05 DIAGNOSIS — J479 Bronchiectasis, uncomplicated: Secondary | ICD-10-CM | POA: Diagnosis not present

## 2021-04-05 DIAGNOSIS — I700 Atherosclerosis of aorta: Secondary | ICD-10-CM | POA: Diagnosis not present

## 2021-04-05 DIAGNOSIS — J439 Emphysema, unspecified: Secondary | ICD-10-CM | POA: Diagnosis not present

## 2021-04-05 DIAGNOSIS — R0902 Hypoxemia: Secondary | ICD-10-CM | POA: Diagnosis not present

## 2021-04-05 DIAGNOSIS — J849 Interstitial pulmonary disease, unspecified: Secondary | ICD-10-CM | POA: Diagnosis not present

## 2021-04-06 ENCOUNTER — Encounter: Payer: Self-pay | Admitting: Internal Medicine

## 2021-04-06 ENCOUNTER — Other Ambulatory Visit: Payer: Self-pay

## 2021-04-06 ENCOUNTER — Ambulatory Visit: Payer: Medicare Other | Admitting: Internal Medicine

## 2021-04-06 VITALS — BP 108/70 | HR 71 | Temp 97.9°F | Ht 65.0 in | Wt 155.4 lb

## 2021-04-06 DIAGNOSIS — R911 Solitary pulmonary nodule: Secondary | ICD-10-CM

## 2021-04-06 DIAGNOSIS — R0600 Dyspnea, unspecified: Secondary | ICD-10-CM

## 2021-04-06 DIAGNOSIS — R0902 Hypoxemia: Secondary | ICD-10-CM | POA: Diagnosis not present

## 2021-04-06 DIAGNOSIS — J849 Interstitial pulmonary disease, unspecified: Secondary | ICD-10-CM

## 2021-04-06 DIAGNOSIS — R0609 Other forms of dyspnea: Secondary | ICD-10-CM

## 2021-04-06 NOTE — Progress Notes (Signed)
OV 04/06/2021  Subjective:  Patient ID: Andrew Cain, male , DOB: 06/01/1946 , age 75 y.o. , MRN: [id number] , ADDRESS: [place] [id number] PCP Andrew Flow, MD Patient Care Team: Andrew Flow, MD as PCP - General (Family Medicine)  This Provider for this visit: Treatment Team:  Attending Provider: Brand Males, MD    04/06/2021 -   Chief Complaint  Patient presents with   Consult    Pt is being referred for ILD evaluation and due to exertional hypoxemia.  Pt was recently diagnosed with pulmonary fibrosis about a couple months ago. Pt has had problems with SOB for years that is worse with exertion.     HPI Andrew Cain 75 y.o. -presents with his daughter Andrew Cain and also one Andrew Cain the outside records his estranged wife died from an MI in 07-01-2020.  Consult as a referral from [facility] family physicians practice Dr. Humphrey Cain and is fine to social lung disease/pulmonary fibrosis.  History is provided by the daughter and also review of the outside records.  He is a lifelong non-smoker.  Some 6 years ago [review the chart indicate it was 2013] he suffered a stroke and since then has been less active.  The daughter started noticing insidious onset of shortness of breath a few years ago.  This was initially put down due to physical deconditioning in the postop setting.  In the last 6 to 8 months its definitely accelerated worsening.  But overall he has been worsening in the last few years.  He is a Designer, jewellery few months ago and was diagnosed with pneumonia with underlying pulmonary fibrosis based on the verbal history.  A follow-up approximately 6 weeks ago showed pulmonary fibrosis on chest x-ray.  He had exertional hypoxemia into the 80s.  He was then started on portable oxygen but advised to use it on 24/7 basis.  The daughter feels that since then he might of had slightly more worsening of shortness of breath.  Class III dyspnea  definitely.  Prior course of steroids did not help this.  Since starting oxygen is also having a postnasal drip and a mild cough.  The cough is definitely new.  Was not there before the oxygen use.  There is no prior history of COVID.  He is a Psychologist, sport and exercise.  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  He is also had increased frequency of falls and this is secondary to antihypertensives but this is improved after one of the hypertensives are all of them were stopped.  He has had a recent cardiac stress test according to the history this was normal.  Outside records show echocardiogram in July 2022: Ejection fraction 60-65%.  Grade 1 diastolic dysfunction normal right ventricle and normal pulmonary artery pressures.  Outside labs indicate normal liver function test, hemoglobin of 14.8 g%, creatinine of 1.1 mg percent in April 2022  His walking desaturation test showed a precipitous decline in pulse ox  Simple office walk 185 feet x  3 laps goal with forehead probe 04/06/2021    O2 used ra   Number laps completed Half of 3   Comments about pace Slow pace and wobbly   Resting Pulse Ox/HR 95% and 77/min   Final Pulse Ox/HR 77% and 108/min   Desaturated </= 88% yes   Desaturated <= 3% points yes   Got Tachycardic >/= 90/min yes   Symptoms  at end of test Dyspnea, half lap wa down to 80s and upon return to room was 77%   Miscellaneous comments Corrected with 2L [place]     High-resolution CT chest done 04/05/2021 and radiology report emailed by the radiologist after the patient left  I looked at Andrew Cain's CT. 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  There are findings of pulmonary fibrosis and emphysema with some subpleural cysts in the upper lungs which are more thick walled, which I think is due to paraseptal emphysema with associated with fibrotic changes and not definitely honeycomb change. There is some probable early honeycomb change of the right lower lobe.   I also  recommended a six-month follow-up for a small solid pulmonary nodule.  Best, Andrew Cain    has a past medical history of Benign essential hypertension, Benign prostatic hyperplasia with lower urinary tract symptoms, Cerebral arteriosclerosis with history of previous stroke, Cerebrovascular accident (CVA) ([place]) (01/22/2015), Gout, Hyperlipidemia, Hypothyroidism (acquired), Memory disturbance, Pure hypercholesterolemia, Thyroid disorder, and Vitamin B12 deficiency.   reports that he has never smoked. He has never used smokeless tobacco.  Past Surgical History:  Procedure Laterality Date   FRACTURE SURGERY     Arm   HERNIA REPAIR     ROTATOR CUFF REPAIR Left     No Known Allergies  Immunization History  Administered Date(s) Administered   Moderna Sars-Covid-2 Vaccination 09/05/2019, 10/07/2019, 07/17/2020    Family History  Problem Relation Age of Onset   Aneurysm Father        thoracic   Other Sister        Auto accident   Renal Disease Brother    Prostate cancer Brother    Prostate cancer Brother    Hypertension Sister    Hypercholesterolemia Sister    Liver disease Son      Current Outpatient Medications:    allopurinol (ZYLOPRIM) 300 MG tablet, Take 300 mg by mouth daily., Disp: , Rfl:    aspirin EC 325 MG tablet, Take 325 mg by mouth daily., Disp: , Rfl:    atorvastatin (LIPITOR) 20 MG tablet, Take 20 mg by mouth daily., Disp: , Rfl:    cyanocobalamin (,VITAMIN B-12,) 1000 MCG/ML injection, Inject 1,000 mcg into the muscle every 30 (thirty) days., Disp: , Rfl:    finasteride (PROSCAR) 5 MG tablet, Take 5 mg by mouth daily., Disp: , Rfl:    FLUoxetine (PROZAC) 20 MG capsule, Take 20 mg by mouth daily., Disp: , Rfl:    levothyroxine (SYNTHROID) 125 MCG tablet, Take 125 mcg by mouth daily before breakfast., Disp: , Rfl:    midodrine (PROAMATINE) 10 MG tablet, Take by mouth., Disp: , Rfl:    tamsulosin (FLOMAX) 0.4 MG CAPS capsule, Take 0.4 mg by mouth at bedtime., Disp: ,  Rfl:       Objective:   Vitals:   04/06/21 1503  BP: 108/70  Pulse: 71  Temp: 97.9 F (36.6 C)  TempSrc: Oral  SpO2: 97%  Weight: 155 lb 6.4 oz (70.5 kg)  Height: 5' 5" (1.651 m)    Estimated body mass index is 25.86 kg/m as calculated from the following:   Height as of this encounter: 5' 5" (1.651 m).   Weight as of this encounter: 155 lb 6.4 oz (70.5 kg).  _0 @  Filed Weights   04/06/21 1503  Weight: 155 lb 6.4 oz (70.5 kg)     Physical Exam  General Appearance:    Alert, cooperative, no distress, appears stated age - yes ,  Deconditioned looking - no , OBESE  - no, Sitting on Wheelchair -  no  Head:    Normocephalic, without obvious abnormality, atraumatic  Eyes:    PERRL, conjunctiva/corneas clear,  Ears:    Normal TM's and external ear canals, both ears  Nose:   Nares normal, septum midline, mucosa normal, no drainage    or sinus tenderness. OXYGEN ON  - yes . Patient is @ 2L [place]   Throat:   Lips, mucosa, and tongue normal; teeth and gums normal. Cyanosis on lips - no  Neck:   Supple, symmetrical, trachea midline, no adenopathy;    thyroid:  no enlargement/tenderness/nodules; no carotid   bruit or JVD  Back:     Symmetric, no curvature, ROM normal, no CVA tenderness  Lungs:     Distress - no , Wheeze no, Barrell Chest - no, Purse lip breathing - no, Crackles -Velcro crackles with a craniocaudal gradient in the lower lobes  Chest Wall:    No tenderness or deformity.    Heart:    Regular rate and rhythm, S1 and S2 normal, no rub   or gallop, Murmur - no  Breast Exam:    NOT DONE  Abdomen:     Soft, non-tender, bowel sounds active all four quadrants,    no masses, no organomegaly. Visceral obesity - no  Genitalia:   NOT DONE  Rectal:   NOT DONE  Extremities:   Extremities - normal, Has Cane - no, Clubbing - no, Edema - no  Pulses:   2+ and symmetric all extremities  Skin:   Stigmata of Connective Tissue Disease - no  Lymph nodes:   Cervical,  supraclavicular, and axillary nodes normal  Psychiatric:  Neurologic:   Pleasant - yes, Anxious - no, Flat affect - no  CAm-ICU - neg, Alert and Oriented x 3 - yes, Moves all 4s - yes, Speech - normal, Cognition - intact         Assessment:       ICD-10-CM   1. ILD (interstitial lung disease) (HCC)  J84.9 Sedimentation rate    ANA    Anti-DNA antibody, double-stranded    Rheumatoid factor    Cyclic citrul peptide antibody, IgG    Sjogren's syndrome antibods(ssa + ssb)    CK total and CKMB (cardiac)not at [facility]    Hypersensitivity Pneumonitis    Anti-scleroderma antibody    Aldolase    CBC with Differential/Platelet    Basic metabolic panel    Hepatic function panel    QuantiFERON-TB Gold Plus    QuantiFERON-TB Gold Plus    Hepatic function panel    Basic metabolic panel    CBC with Differential/Platelet    Aldolase    Anti-scleroderma antibody    Hypersensitivity Pneumonitis    CK total and CKMB (cardiac)not at [facility]    Sjogren's syndrome antibods(ssa + ssb)    Cyclic citrul peptide antibody, IgG    Rheumatoid factor    Anti-DNA antibody, double-stranded    ANA    Sedimentation rate    Pulmonary function test    AMB referral to pulmonary rehabilitation    2. Dyspnea on exertion  R06.00     3. Exercise hypoxemia  R09.02          Plan:     Patient Instructions     ICD-10-CM   1. ILD (interstitial lung disease) ([place])  J84.9     2. Dyspnea on exertion  R06.00     3. Exercise  hypoxemia  R09.02        - You  have Interstitial Lung Disease (ILD) aka Pulmonary Fibrosis (PF)  -  There are MANY varieties of this - I am suspecting a variety called IPF baesd on age > 50, male, caucasian, velcro crackles with craniocaudal gradient [probable UIP on CT scan per radiologist after he left the office] and progression. 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 - To narrow down possibilities and assess severity please do the  following tests  - do full PFT next few weeks  - take ILD questionnare home and bring it back next visit  - will get access to yoour High Resolution CT chest result - have emailed the radiologists  - do autoimmune panel 04/06/2021 = : Serum: ESR, ANA, DS-DNA, RF, anti-CCP, ssA, ssB, scl-70, Total CK,  Aldolase,  Hypersensitivity Pneumonitis Panel,  - do blood Quantiferon Gold 04/06/2021 - do cbc, bmet, lft 04/06/2021 - contnue o2 for anything > 25 feet exertion and at night - 2L [place] - refer pulmonary rehab  [place]  -1-3 weeks with APP/Arcenio Mullaly 30 min slot to discuss results to determne type of ILD - biopsy a consideration but doubt you will need it - also make appt with Pharmacist Devki on same day after MD/APP visit to discuss antifibrotics esp given progressive hisotry  - bring ILD packet back with you  - at return walk on 2L [place] to see if you can do all 3 LApas       SIGNATURE    Dr. Brand Cain, M.D., F.C.C.P,  Pulmonary and Critical Care Medicine Staff Physician, [place] Director - Interstitial Lung Disease  Program  Pulmonary [place] at [place], [place], [address]  Pager: [phone], If no answer or between  15:00h - 7:00h: call [phone] Telephone: [phone]  5:15 PM 04/06/2021

## 2021-04-06 NOTE — Patient Instructions (Addendum)
ICD-10-CM   1. ILD (interstitial lung disease) (HCC)  J84.9 Sedimentation rate    ANA    Anti-DNA antibody, double-stranded    Rheumatoid factor    Cyclic citrul peptide antibody, IgG    Sjogren's syndrome antibods(ssa + ssb)    CK total and CKMB (cardiac)not at [facility]    Hypersensitivity Pneumonitis    Anti-scleroderma antibody    Aldolase    CBC with Differential/Platelet    Basic metabolic panel    Hepatic function panel    QuantiFERON-TB Gold Plus    QuantiFERON-TB Gold Plus    Hepatic function panel    Basic metabolic panel    CBC with Differential/Platelet    Aldolase    Anti-scleroderma antibody    Hypersensitivity Pneumonitis    CK total and CKMB (cardiac)not at [facility]    Sjogren's syndrome antibods(ssa + ssb)    Cyclic citrul peptide antibody, IgG    Rheumatoid factor    Anti-DNA antibody, double-stranded    ANA    Sedimentation rate    Pulmonary function test    AMB referral to pulmonary rehabilitation    2. Dyspnea on exertion  R06.00     3. Exercise hypoxemia  R09.02     4. Pulmonary nodule  R91.1        - You  have Interstitial Lung Disease (ILD) aka Pulmonary Fibrosis (PF)  -  There are MANY varieties of this - I am suspecting a variety called IPF baesd on age > 46, male, caucasian, velcro crackles with craniocaudal gradient [probable UIP on CT scan per radiologist after he left the office]  and and progression. 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 - To narrow down possibilities and assess severity please do the following tests  - do full PFT next few weeks  - take ILD questionnare home and bring it back next visit  - will get access to yoour High Resolution CT chest result - have emailed the radiologists  - do autoimmune panel 04/06/2021 = : Serum: ESR, ANA, DS-DNA, RF, anti-CCP, ssA, ssB, scl-70, Total CK,  Aldolase,  Hypersensitivity Pneumonitis Panel,  - do blood Quantiferon Gold 04/06/2021 - do cbc, bmet,  lft 04/06/2021 - contnue o2 for anything > 25 feet exertion and at night - 2L [place] - refer pulmonary rehab  [place]  -1-3 weeks with APP/[place]ola Perryman 30 min slot to discuss results to determne type of ILD   - 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

## 2021-04-07 ENCOUNTER — Telehealth (HOSPITAL_COMMUNITY): Payer: Self-pay | Admitting: *Deleted

## 2021-04-07 LAB — CBC WITH DIFFERENTIAL/PLATELET
Basophils Absolute: 0.1 10*3/uL (ref 0.0–0.1)
Basophils Relative: 1.3 % (ref 0.0–3.0)
Eosinophils Absolute: 0.2 10*3/uL (ref 0.0–0.7)
Eosinophils Relative: 3.5 % (ref 0.0–5.0)
HCT: 42.9 % (ref 39.0–52.0)
Hemoglobin: 14.2 g/dL (ref 13.0–17.0)
Lymphocytes Relative: 21.9 % (ref 12.0–46.0)
Lymphs Abs: 1.2 10*3/uL (ref 0.7–4.0)
MCHC: 33.2 g/dL (ref 30.0–36.0)
MCV: 98.5 fl (ref 78.0–100.0)
Monocytes Absolute: 0.4 10*3/uL (ref 0.1–1.0)
Monocytes Relative: 6.6 % (ref 3.0–12.0)
Neutro Abs: 3.7 10*3/uL (ref 1.4–7.7)
Neutrophils Relative %: 66.7 % (ref 43.0–77.0)
Platelets: 182 10*3/uL (ref 150.0–400.0)
RBC: 4.35 Mil/uL (ref 4.22–5.81)
RDW: 14.5 % (ref 11.5–15.5)
WBC: 5.5 10*3/uL (ref 4.0–10.5)

## 2021-04-07 LAB — BASIC METABOLIC PANEL
BUN: 13 mg/dL (ref 6–23)
CO2: 27 mEq/L (ref 19–32)
Calcium: 9.7 mg/dL (ref 8.4–10.5)
Chloride: 102 mEq/L (ref 96–112)
Creatinine, Ser: 0.99 mg/dL (ref 0.40–1.50)
GFR: 74.2 mL/min (ref >60.00)
Glucose, Bld: 86 mg/dL (ref 70–99)
Potassium: 4 mEq/L (ref 3.5–5.1)
Sodium: 138 mEq/L (ref 135–145)

## 2021-04-07 LAB — HEPATIC FUNCTION PANEL
ALT: 20 U/L (ref 0–53)
AST: 22 U/L (ref 0–37)
Albumin: 4.3 g/dL (ref 3.5–5.2)
Alkaline Phosphatase: 130 U/L — ABNORMAL HIGH (ref 39–117)
Bilirubin, Direct: 0.1 mg/dL (ref 0.0–0.3)
Total Bilirubin: 0.7 mg/dL (ref 0.2–1.2)
Total Protein: 7.2 g/dL (ref 6.0–8.3)

## 2021-04-07 LAB — SEDIMENTATION RATE: Sed Rate: 13 mm/hr (ref 0–20)

## 2021-04-07 NOTE — Telephone Encounter (Signed)
Received referral for this pt to participate in Pulmonary Rehab. Noted that pt lives in [place].  Attempted to contact pt by phone unable to leave message.  Will mail brochure for [facility] program as well as contact information for [facility].  Will also request to please contact for preferred location.Cherre Huger, BSN Cardiac and Training and development officer

## 2021-04-08 DIAGNOSIS — J849 Interstitial pulmonary disease, unspecified: Secondary | ICD-10-CM | POA: Diagnosis not present

## 2021-04-10 LAB — CK TOTAL AND CKMB (NOT AT ARMC)
CK, MB: 0.7 ng/mL (ref 0–5.0)
Relative Index: 2.3 (ref 0–4.0)
Total CK: 31 U/L — ABNORMAL LOW (ref 44–196)

## 2021-04-10 LAB — ANTI-DNA ANTIBODY, DOUBLE-STRANDED: ds DNA Ab: <1 IU/mL

## 2021-04-10 LAB — ANTI-NUCLEAR AB-TITER (ANA TITER): ANA Titer 1: 1:160 {titer} — ABNORMAL HIGH

## 2021-04-10 LAB — SJOGREN'S SYNDROME ANTIBODS(SSA + SSB)
SSA (Ro) (ENA) Antibody, IgG: <1 AI
SSB (La) (ENA) Antibody, IgG: <1 AI

## 2021-04-10 LAB — QUANTIFERON-TB GOLD PLUS
Mitogen-NIL: >10 IU/mL
NIL: 0.04 IU/mL
QuantiFERON-TB Gold Plus: NEGATIVE
TB1-NIL: 0.01 IU/mL
TB2-NIL: 0 IU/mL

## 2021-04-10 LAB — ANTI-SCLERODERMA ANTIBODY: Scleroderma (Scl-70) (ENA) Antibody, IgG: <1 AI

## 2021-04-10 LAB — RHEUMATOID FACTOR: Rheumatoid fact SerPl-aCnc: <14 IU/mL (ref <14)

## 2021-04-10 LAB — ANA: Anti Nuclear Antibody (ANA): POSITIVE — AB

## 2021-04-10 LAB — ALDOLASE: Aldolase: 3.4 U/L (ref <=8.1)

## 2021-04-10 LAB — CYCLIC CITRUL PEPTIDE ANTIBODY, IGG: Cyclic Citrullin Peptide Ab: <16 UNITS

## 2021-04-13 LAB — HYPERSENSITIVITY PNEUMONITIS
A. Pullulans Abs: NEGATIVE
A.Fumigatus #1 Abs: NEGATIVE
Micropolyspora faeni, IgG: NEGATIVE
Pigeon Serum Abs: NEGATIVE
Thermoact. Saccharii: NEGATIVE
Thermoactinomyces vulgaris, IgG: NEGATIVE

## 2021-04-28 ENCOUNTER — Ambulatory Visit: Payer: Medicare Other | Admitting: Primary Care

## 2021-04-28 ENCOUNTER — Telehealth: Payer: Self-pay | Admitting: Pharmacist

## 2021-04-28 ENCOUNTER — Encounter: Payer: Self-pay | Admitting: Primary Care

## 2021-04-28 ENCOUNTER — Other Ambulatory Visit: Payer: Self-pay

## 2021-04-28 ENCOUNTER — Ambulatory Visit: Payer: Medicare Other | Admitting: Pharmacist

## 2021-04-28 ENCOUNTER — Ambulatory Visit (INDEPENDENT_AMBULATORY_CARE_PROVIDER_SITE_OTHER): Payer: Medicare Other | Admitting: Internal Medicine

## 2021-04-28 DIAGNOSIS — Z7189 Other specified counseling: Secondary | ICD-10-CM

## 2021-04-28 DIAGNOSIS — J849 Interstitial pulmonary disease, unspecified: Secondary | ICD-10-CM

## 2021-04-28 DIAGNOSIS — Z5181 Encounter for therapeutic drug level monitoring: Secondary | ICD-10-CM

## 2021-04-28 DIAGNOSIS — J84112 Idiopathic pulmonary fibrosis: Secondary | ICD-10-CM

## 2021-04-28 LAB — PULMONARY FUNCTION TEST
DL/VA % pred: 85 %
DL/VA: 3.46 ml/min/mmHg/L
DLCO cor % pred: 35 %
DLCO cor: 7.32 ml/min/mmHg
DLCO unc % pred: 35 %
DLCO unc: 7.24 ml/min/mmHg
FEF 25-75 Post: 2.27 L/sec
FEF 25-75 Pre: 2.37 L/sec
FEF2575-%Change-Post: -4 %
FEF2575-%Pred-Post: 138 %
FEF2575-%Pred-Pre: 144 %
FEV1-%Change-Post: 4 %
FEV1-%Pred-Post: 69 %
FEV1-%Pred-Pre: 66 %
FEV1-Post: 1.59 L
FEV1-Pre: 1.52 L
FEV1FVC-%Change-Post: -1 %
FEV1FVC-%Pred-Pre: 121 %
FEV6-%Change-Post: 5 %
FEV6-%Pred-Post: 61 %
FEV6-%Pred-Pre: 58 %
FEV6-Post: 1.83 L
FEV6-Pre: 1.73 L
FEV6FVC-%Pred-Post: 108 %
FEV6FVC-%Pred-Pre: 108 %
FVC-%Change-Post: 5 %
FVC-%Pred-Post: 56 %
FVC-%Pred-Pre: 53 %
FVC-Post: 1.83 L
FVC-Pre: 1.73 L
Post FEV1/FVC ratio: 87 %
Post FEV6/FVC ratio: 100 %
Pre FEV1/FVC ratio: 88 %
Pre FEV6/FVC Ratio: 100 %
RV % pred: 6 %
RV: 0.15 L
TLC % pred: 53 %
TLC: 3.15 L

## 2021-04-28 NOTE — Progress Notes (Signed)
_0  ID: Colin Rhein, male    DOB: 1946-02-21, 75 y.o.   MRN: [id number]  Chief Complaint  Patient presents with   Follow-up    PFT done today.    Referring provider: Mateo Flow, MD  HPI: 75 year old male, never smoked.  Past medical history significant for hypertension, CVA, dyspnea on exertion, mixed dyslipidemia.  Patient of Dr. Chase Caller, seen for initial consult on 04/06/2021 for ILD.  Previous [facility] pulmonary encounter: 04/06/2021 -   Chief Complaint  Patient presents with   Consult    Pt is being referred for ILD evaluation and due to exertional hypoxemia.  Pt was recently diagnosed with pulmonary fibrosis about a couple months ago. Pt has had problems with SOB for years that is worse with exertion.     HPI DONIVEN VANPATTEN 75 y.o. -presents with his daughter [name] and also one Simeon Craft the outside records his estranged wife died from an MI in 05-Jul-2020.  Consult as a referral from [facility] practice Dr. Humphrey Rolls and is fine to social lung disease/pulmonary fibrosis.  History is provided by the daughter and also review of the outside records.  He is a lifelong non-smoker.  Some 6 years ago [review the chart indicate it was 2013] he suffered a stroke and since then has been less active.  The daughter started noticing insidious onset of shortness of breath a few years ago.  This was initially put down due to physical deconditioning in the postop setting.  In the last 6 to 8 months its definitely accelerated worsening.  But overall he has been worsening in the last few years.  He is a Designer, jewellery few months ago and was diagnosed with pneumonia with underlying pulmonary fibrosis based on the verbal history.  A follow-up approximately 6 weeks ago showed pulmonary fibrosis on chest x-ray.  He had exertional hypoxemia into the 80s.  He was then started on portable oxygen but advised to use it on 24/7 basis.  The daughter feels that since then he might of had  slightly more worsening of shortness of breath.  Class III dyspnea definitely.  Prior course of steroids did not help this.  Since starting oxygen is also having a postnasal drip and a mild cough.  The cough is definitely new.  Was not there before the oxygen use.  There is no prior history of COVID.  He is a Psychologist, sport and exercise.  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  He is also had increased frequency of falls and this is secondary to antihypertensives but this is improved after one of the hypertensives are all of them were stopped.  He has had a recent cardiac stress test according to the history this was normal.  Outside records show echocardiogram in July 2022: Ejection fraction 60-65%.  Grade 1 diastolic dysfunction normal right ventricle and normal pulmonary artery pressures.  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 Alternatively can be be Hypersensitivity Pneumonitis (HP)  based on tobacco farming - To narrow down possibilities and assess severity please do the following tests             - do full PFT next few weeks             - take ILD questionnare home and bring it back next visit             - will get access to yoour High Resolution CT chest result - have emailed the radiologists             - do autoimmune panel 04/06/2021 = : Serum: ESR, ANA, DS-DNA, RF, anti-CCP, ssA, ssB, scl-70, Total CK,  Aldolase,  Hypersensitivity Pneumonitis Panel,  - do blood Quantiferon Gold 04/06/2021 - do cbc, bmet, lft 04/06/2021 -  contnue o2 for anything > 25 feet exertion and at night - 2L [place] - refer pulmonary rehab   Folllowup  -1-3 weeks with APP/Ramaswamy 30 min slot to discuss results to determne type of ILD - biopsy a consideration but doubt you will need it - also make appt with Pharmacist Devki on same day after MD/APP visit to discuss antifibrotics esp given progressive hisotry             - bring ILD packet back with you             - at return walk on 2L  to see if you can do all 3 LApas     04/28/2021- interim hx  Patient presents today for 1-3 week follow-up. He was recently diagnosed with ILD. CT imaging has shown progression of fibrosis, probable UIP pattern. He was ordered for PFTs, ILD serology and given ILD questionnaire. He has been referred to pulmonary rehab.   He is doing about the same today. No acute complaints except for progressive dyspnea symptoms. Accompanied by his daughter. He is very hard of hearing. Shortness of breath began gradually and has worsened over the last 2 years.  He has difficulty keeping up with others of his age.  There is some associated wheezing with cough.  He often has to clear his throat. Wearing 2-3L pulsed oxygen today. His oxygen desaturated on ambulatory walk test today in office on 2L POC. Serology was negative, he had trace positive ANA. He is meeting with pharmacist this afternoon to discuss antifibrotics.    Shortness of breath score 04/28/2021  0-5 scale with 5 being worst  At rest 4  Simple tasks-shower, clothes change, eating, shaving 6  Housework (dishes, doing bed, laundry, vacuuming) 6  Shopping 6  Walking level at own pace 6  Walking upstairs 6  Total 34   Cough 2.5  Fatigue 5  Appetite  4  Nausea  2  Vomiting 0  Diarrhea  0  Anxiety 2  Depression 0  Chronic pain , if so how bad  0     Simple office walk 185 feet x  3 laps goal with forehead probe 04/06/2021  04/28/2021   O2 used ra 2L POC  Number laps completed Half of 3   Comments  about pace Slow pace and wobbly Slow pace   Resting Pulse Ox/HR 95% and 77/min 95% and 86/min  Final Pulse Ox/HR 77% and 108/min 86% and 122/min  Desaturated </= 88% yes Yes  Desaturated <= 3% points yes Yes   Got Tachycardic >/= 90/min yes Yes  Symptoms at end of test Dyspnea, half lap wa down to 80s and upon return to room  was 77%   Miscellaneous comments Corrected with 2L [place] Corrected with 3L POC    Lab testing:  ANA positive  ANA titer 1:160 positive  RF negative Sed rate 13 CCP <16 Anti-DNA antibody negative Scleroderma antibody negative Hypersensitivity pneumonitis panel negative Sjogren's syndrome antibods negative  Pulmonary function testing: 04/28/2021 -FVC 1.83 (56%), FEV1 1.59 (69%) 1.59 (69%), ratio 87, TLC 53%, DLCOunc 7.32 (35%)/ Interpretation: moderate restriction with severe diffusion defect  ILD QUESTIONNAIRE  Past medical history Positive for asthma, hernia, hypertension, thyroid disease, stroke, hypertension  ROS: Positive for fatigue, joint stiffness/pain/swelling, difficulty swallowing or food getting stuck in your throat, persistent dry eyes, pain or color change, weight loss, snoring/morning headaches/excessive daytime sleepiness  Family history: Positive for pulmonary fibrosis, premature graying of hair or dyskeratosis congenita   Exposure history Tobacco use: Patient is a non-smoker but has had secondhand smoke exposure.  No cigar, pipe or vape use.  No marijuana use.  Cocaine use.  No IV drug use.  Home and Hobby details: [place] 20 years.  Age of current home 63.  Occasional mice.  No exposure to mold or mildew exposure. No CPAP.  or nebulizer.  No Jacuzzis.  He does garden, tobacco farmer.  He has been exposed to birds or feathers-previously had chicken houses, currently only has a rooster that lives outside. Exposure to cows.    Occupational history Organic-alcohol significant for  gardening, farming, oblique protection, Chiropodist, GERD/poultry breeder  Inorganic- no significant exposures  Medication history: Significant for prednisone   No Known Allergies  Immunization History  Administered Date(s) Administered   [employer] Vaccination 09/05/2019, 10/07/2019, 07/17/2020    Past Medical History:  Diagnosis Date   Benign essential hypertension    Benign prostatic hyperplasia with lower urinary tract symptoms    Cerebral arteriosclerosis with history of previous stroke    Cerebrovascular accident (CVA) ([place]) 01/22/2015   Gout    Hyperlipidemia    Hypothyroidism (acquired)    Memory disturbance    Pure hypercholesterolemia    Thyroid disorder    Vitamin B12 deficiency     Tobacco History: Social History   Tobacco Use  Smoking Status Never  Smokeless Tobacco Never   Counseling given: Not Answered   Outpatient Medications Prior to Visit  Medication Sig Dispense Refill   allopurinol (ZYLOPRIM) 300 MG tablet Take 300 mg by mouth daily.     aspirin EC 325 MG tablet Take 325 mg by mouth daily.     atorvastatin (LIPITOR) 20 MG tablet Take 20 mg by mouth daily.     cyanocobalamin (,VITAMIN B-12,) 1000 MCG/ML injection Inject 1,000 mcg into the muscle every 30 (thirty) days.     finasteride (PROSCAR) 5 MG tablet Take 5 mg by mouth daily.     FLUoxetine (PROZAC) 20 MG capsule Take 20 mg by mouth daily.     levothyroxine (SYNTHROID) 125 MCG tablet Take 125 mcg by mouth daily before breakfast.     midodrine (PROAMATINE) 10 MG tablet Take by mouth.     tamsulosin (FLOMAX) 0.4 MG CAPS capsule Take 0.4 mg by mouth at bedtime.     No facility-administered medications prior to visit.    Review of Systems  Review of Systems  Constitutional:  Positive for fatigue.  HENT: Negative.    Respiratory:  Positive for cough and shortness of breath. Negative for chest tightness and wheezing.   Cardiovascular: Negative.   Musculoskeletal:  Positive for  arthralgias.    Physical Exam  BP 118/68 (BP Location: Left Arm, Cuff Size: Normal)   Pulse 91   Temp (!) 97.5 F (36.4 C)   Ht _0  (1.626 m)   Wt 158 lb (71.7 kg)   SpO2 91% Comment: 3L  BMI 27.12 kg/m  Physical Exam Constitutional:      Appearance: Normal appearance.  HENT:     Head: Normocephalic and atraumatic.     Ears:     Comments: Hard of hearing    Mouth/Throat:     Comments: Deferred d/t masking Cardiovascular:     Rate and Rhythm: Normal rate and regular rhythm.  Pulmonary:     Effort: Pulmonary effort is normal. No respiratory distress.     Breath sounds: No stridor. Rales present.     Comments: Velcro crackles to bases; 3L POC  Musculoskeletal:        General: Normal range of motion.  Skin:    General: Skin is warm and dry.  Neurological:     General: No focal deficit present.     Mental Status: He is alert and oriented to person, place, and time. Mental status is at baseline.  Psychiatric:        Mood and Affect: Mood normal.        Behavior: Behavior normal.        Thought Content: Thought content normal.        Judgment: Judgment normal.     Lab Results:  CBC    Component Value Date/Time   WBC 5.5 04/06/2021 1632   RBC 4.35 04/06/2021 1632   HGB 14.2 04/06/2021 1632   HGB 15.2 12/23/2011 2210   HCT 42.9 04/06/2021 1632   HCT 44.7 12/23/2011 2210   PLT 182.0 04/06/2021 1632   PLT 208 12/23/2011 2210   MCV 98.5 04/06/2021 1632   MCV 100 12/23/2011 2210   MCH 33.9 12/23/2011 2210   MCHC 33.2 04/06/2021 1632   RDW 14.5 04/06/2021 1632   RDW 13.4 12/23/2011 2210   LYMPHSABS 1.2 04/06/2021 1632   MONOABS 0.4 04/06/2021 1632   EOSABS 0.2 04/06/2021 1632   BASOSABS 0.1 04/06/2021 1632    BMET    Component Value Date/Time   NA 138 04/06/2021 1632   NA 135 (L) 12/23/2011 2210   K 4.0 04/06/2021 1632   K 3.6 12/23/2011 2210   CL 102 04/06/2021 1632   CL 101 12/23/2011 2210   CO2 27 04/06/2021 1632   CO2 20 (L) 12/23/2011 2210    GLUCOSE 86 04/06/2021 1632   GLUCOSE 111 (H) 12/23/2011 2210   BUN 13 04/06/2021 1632   BUN 23 (H) 12/23/2011 2210   CREATININE 0.99 04/06/2021 1632   CREATININE 1.42 (H) 12/23/2011 2210   CALCIUM 9.7 04/06/2021 1632   CALCIUM 8.6 12/23/2011 2210   GFRNONAA 51 (L) 12/23/2011 2210   GFRAA 59 (L) 12/23/2011 2210    BNP No results found for: BNP  ProBNP No results found for: PROBNP  Imaging: No results found.   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 Serology and Hypersensitivity pneumonitis panel were negative. ANA was trace positive. He is not a good candidate for lung bx and family did not wish to proceed with diagnostic procedure.  - Recommendation starting antifibrotic medication after meeting with pharmacist. Plan is to  start Esbriet. Goal is to slow the progression of the fibrosis. Advise he monitor for GI side effects and weight loss. He will need liver function test monthly x 6 months and then every 3 months moving forward. He will also need repeat spirometry with DLCO in 3-6 months.  - Advised patient's daughter to call pulmonary rehab to set up  - Follow-up in 6 weeks with Dr. Chase Caller (30 min visit-labs prior)   > 40 mins spent on case; >50% face to face   Martyn Ehrich, NP 04/28/2021

## 2021-04-28 NOTE — Telephone Encounter (Signed)
Please start Esbriet BIV.  Dose: with 270m tabs - Take 1 tab three times daily for 7 days, then 2 tabs three times daily for 7 days, then 3 tabs three times daily thereafter.  Dx: IPF ((O84.166  Patient signed [employer] PAP application today at pharmacy clinic visit. Awaiting signed provider portion of PAP application  DKnox Saliva PharmD, MPH, BCPS Clinical Pharmacist (Rheumatology and Pulmonology)

## 2021-04-28 NOTE — Telephone Encounter (Signed)
Submitted a Prior Authorization request to [facility] for ESBRIET via CoverMyMeds. Will update once we receive a response.   Key: [id number]

## 2021-04-28 NOTE — Progress Notes (Signed)
Subjective:  Patient presents today to [facility] to see pharmacy team for counseling on antifibrotics.   Patient seen today after his OV with Derl Barrow, NP, today.  Pertinent past medical history includes IPF, HTN, CVA.  No history of MI. Not on anticoagulation but does have history of stroke.  History of elevated LFTs: No History of diarrhea, nausea, vomiting: No  Objective: No Known Allergies  Outpatient Encounter Medications as of 04/28/2021  Medication Sig   allopurinol (ZYLOPRIM) 300 MG tablet Take 300 mg by mouth daily.   aspirin EC 325 MG tablet Take 325 mg by mouth daily.   atorvastatin (LIPITOR) 20 MG tablet Take 20 mg by mouth daily.   cyanocobalamin (,VITAMIN B-12,) 1000 MCG/ML injection Inject 1,000 mcg into the muscle every 30 (thirty) days.   finasteride (PROSCAR) 5 MG tablet Take 5 mg by mouth daily.   FLUoxetine (PROZAC) 20 MG capsule Take 20 mg by mouth daily.   levothyroxine (SYNTHROID) 125 MCG tablet Take 125 mcg by mouth daily before breakfast.   midodrine (PROAMATINE) 10 MG tablet Take by mouth.   tamsulosin (FLOMAX) 0.4 MG CAPS capsule Take 0.4 mg by mouth at bedtime.   No facility-administered encounter medications on file as of 04/28/2021.     Immunization History  Administered Date(s) Administered   Moderna Sars-Covid-2 Vaccination 09/05/2019, 10/07/2019, 07/17/2020    PFT's TLC  Date Value Ref Range Status  04/28/2021 3.15 L Preliminary      CMP     Component Value Date/Time   NA 138 04/06/2021 1632   NA 135 (L) 12/23/2011 2210   K 4.0 04/06/2021 1632   K 3.6 12/23/2011 2210   CL 102 04/06/2021 1632   CL 101 12/23/2011 2210   CO2 27 04/06/2021 1632   CO2 20 (L) 12/23/2011 2210   GLUCOSE 86 04/06/2021 1632   GLUCOSE 111 (H) 12/23/2011 2210   BUN 13 04/06/2021 1632   BUN 23 (H) 12/23/2011 2210   CREATININE 0.99 04/06/2021 1632   CREATININE 1.42 (H) 12/23/2011 2210   CALCIUM 9.7 04/06/2021 1632   CALCIUM 8.6 12/23/2011 2210    PROT 7.2 04/06/2021 1632   PROT 7.9 12/23/2011 2210   ALBUMIN 4.3 04/06/2021 1632   ALBUMIN 4.1 12/23/2011 2210   AST 22 04/06/2021 1632   AST 69 (H) 12/23/2011 2210   ALT 20 04/06/2021 1632   ALT 64 12/23/2011 2210   ALKPHOS 130 (H) 04/06/2021 1632   ALKPHOS 88 12/23/2011 2210   BILITOT 0.7 04/06/2021 1632   BILITOT 0.6 12/23/2011 2210   GFRNONAA 51 (L) 12/23/2011 2210   GFRAA 59 (L) 12/23/2011 2210    CBC    Component Value Date/Time   WBC 5.5 04/06/2021 1632   RBC 4.35 04/06/2021 1632   HGB 14.2 04/06/2021 1632   HGB 15.2 12/23/2011 2210   HCT 42.9 04/06/2021 1632   HCT 44.7 12/23/2011 2210   PLT 182.0 04/06/2021 1632   PLT 208 12/23/2011 2210   MCV 98.5 04/06/2021 1632   MCV 100 12/23/2011 2210   MCH 33.9 12/23/2011 2210   MCHC 33.2 04/06/2021 1632   RDW 14.5 04/06/2021 1632   RDW 13.4 12/23/2011 2210   LYMPHSABS 1.2 04/06/2021 1632   MONOABS 0.4 04/06/2021 1632   EOSABS 0.2 04/06/2021 1632   BASOSABS 0.1 04/06/2021 1632    LFT's Hepatic Function Latest Ref Rng & Units 04/06/2021 12/23/2011  Total Protein 6.0 - 8.3 g/dL 7.2 7.9  Albumin 3.5 - 5.2 g/dL 4.3 4.1  AST 0 - 37 U/L 22 69(H)  ALT 0 - 53 U/L 20 64  Alk Phosphatase 39 - 117 U/L 130(H) 88  Total Bilirubin 0.2 - 1.2 mg/dL 0.7 0.6  Bilirubin, Direct 0.0 - 0.3 mg/dL 0.1 -    HRCT (04/05/21 - not in Epic) - probable UIP - There are findings of pulmonary fibrosis and emphysema with some subpleural cysts in the upper lungs which are more thick walled, which I think is due to paraseptal emphysema with associated with fibrotic changes and not definitely honeycomb change. There is some probable early honeycomb change of the right lower lobe.   Assessment and Plan [name]. [name] does not have any specific caution or contraindication to Ofev or Esbriet use and we reviewed both medications in detail as below.  Reviewed that the goal is to slow progression of IPF for both. Reviewed similar hepatic function monitoring.     Esbriet Medication Management Thoroughly counseled patient on the efficacy, mechanism of action, dosing, administration, adverse effects, and monitoring parameters of Esbriet.  Patient verbalized understanding. Patient education handout provided.    Goals of Therapy: Will not stop or reverse the progression of ILD. It will slow the progression of ILD.    Dosing: Starting dose will be Esbriet 267 mg 1 tablet three times daily for 7 days, then 2 tablets three times daily for 7 days, then 3 tablets three times daily.  Maintenance dose will be 801 mg 1 tablet three times daily if tolerated.  Stressed the importance of taking with meals and space at least 5-6 hours apart to minimize stomach upset.    Adverse Effects: Nausea, vomiting, diarrhea, weight loss Abdominal pain GERD Sun sensitivity/rash - patient advised to wear sunscreen when exposed to sunlight Dizziness Fatigue   Monitoring: Draw hepatic function panel today for updated baseline Monitor for diarrhea, nausea and vomiting, GI perforation, hepatotoxicity  Monitor LFTs - baseline, monthly for first 6 months, then every 3 months routinely CBC w differential at baseline and every 3 months routinely   Ofev Medication Management Thoroughly counseled patient on the efficacy, mechanism of action, dosing, administration, adverse effects, and monitoring parameters of Ofev. Patient verbalized understanding. Patient education handout provided.    Goals of Therapy: Will not stop or reverse the progression of ILD. It will slow the progression of ILD.  Inhibits tyrosine kinase inhibitors which slow the fibrosis/progression of ILD -Significant reduction in the rate of disease progression was observed after treatment (61.1% [before] vs 33.3% [after], P?=?0.008) over 42 weeks.   Ofev Dosing: 150 mg (one capsule) by mouth twice daily (approx 12 hours apart). Discussed taking with food approximately 12 hours apart. Discussed that capsule should  not be crushed or split.   Ofev Adverse Effects: Nausea, vomiting, diarrhea (2 in 3 patients) appetite loss, weight loss - management of diarrhea with loperamide discussed including max use of 48 hours and max of 8 capsules per day. Abdominal pain (up to 1 in 5 patients) Nasopharyngitis (13%), UTI (6%) Risk of thrombosis (3%) and acute MI (2%) Hypertension (5%) Dizziness Fatigue (10%)   After reviewing of all of the above in detail, patient stated that he would prefer nausea over diarrhea and would like to pursue Esbriet first. Patient assistance application for [employer] completed today. Pending provider portion.  Medication Reconciliation A drug regimen assessment was performed, including review of allergies, interactions, disease-state management, dosing and immunization history. Medications were reviewed with the patient, including name, instructions, indication, goals of therapy, potential side effects,  importance of adherence, and safe use.  This appointment required 60 minutes of patient care (this includes precharting, chart review, review of results, face-to-face care, etc.).  Thank you for involving pharmacy to assist in providing this patient's care.   Knox Saliva, PharmD, MPH, BCPS Clinical Pharmacist (Rheumatology and Pulmonology)

## 2021-04-28 NOTE — Assessment & Plan Note (Addendum)
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 Serology and Hypersensitivity pneumonitis panel were negative. ANA was trace positive. He is not a good candidate for lung bx and family did not wish to proceed with diagnostic procedure.  - Recommendation starting antifibrotic medication after meeting with pharmacist. Plan is to start [place]. Goal is to slow the progression of the fibrosis. Advise he monitor for GI side effects and weight loss. He will need liver function test monthly x 6 months and then every 3 months moving forward. He will also need repeat spirometry with DLCO in 3-6 months.  - Advised patient's daughter to call pulmonary rehab to set up  - Follow-up in 6 weeks with Dr. Chase Caller (30 min visit-labs prior)

## 2021-04-28 NOTE — Progress Notes (Signed)
PFT done today.

## 2021-04-28 NOTE — Patient Instructions (Addendum)
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 Serology and Hypersensitivity pneumonitis panel were negative    Recommendations: Start anti-fibrotic medication / Monitor for GI side effects and weight loss  Use 3L oxygen POC with ambulation  Call pulmonary rehab number   Orders: Spirometry with DLCO in 3 months  You will need labs monthly for the first 6 months then every 3 months   Follow-up: 6 week follow-up with Dr. Chase Caller 30 min visit (labs prior)     Pulmonary Fibrosis Pulmonary fibrosis is a type of lung disease that causes scarring. Over time, the scar tissue builds up in the air sacs of your lungs (alveoli). This makes it hard for you to breathe. Less oxygen can get into your blood. Scarring from pulmonary fibrosis gets worse over time. This damage is permanent and may lead to other serious health problems. What are the causes? There are many different causes of pulmonary fibrosis. Sometimes the cause is not known. This is called idiopathic pulmonary fibrosis. Other causes include: Exposure to chemicals and substances found in agricultural, farm, Architect, or factory work. These include mold, asbestos, silica, metal dusts, and toxic fumes. Sarcoidosis. In this disease, areas of inflammatory cells (granulomas) form and most often affect the lungs. Autoimmune diseases. These include diseases such as rheumatoid arthritis, systemic sclerosis, or connective tissue disease. Taking certain medicines. These include drugs used in radiation therapy or used to treat seizures, heart problems, and some infections. What increases the risk? You are more likely to develop this condition if: You have a family history of the disease. You are older. The condition is more common in older  adults. You have a history of smoking. You have a job that exposes you to certain chemicals. You have gastroesophageal reflux disease (GERD). What are the signs or symptoms? Symptoms of this condition include: Difficulty breathing that gets worse with activity. Shortness of breath (dyspnea). Dry, hacking cough. Rapid, shallow breathing during exercise or while at rest. Bluish skin and lips. Loss of appetite. Weakness. Weight loss and fatigue. Rounded and enlarged fingertips (clubbing). How is this diagnosed? This condition may be diagnosed based on: Your symptoms and medical history. A physical exam. You may also have tests, including: A test that involves looking inside your lungs with an instrument (bronchoscopy). Imaging studies of your lungs and heart. Tests to measure how well you are breathing (pulmonary function tests). Blood tests. Tests to see how well your lungs work while you are walking (pulmonary stress test). A procedure to remove a lung tissue sample to look at it under a microscope (biopsy). How is this treated? There is no cure for pulmonary fibrosis. Treatment focuses on managing symptoms and preventing scarring from getting worse. This may include: Medicines, such as: Steroids to prevent permanent lung changes. Medicines to suppress your body's defense system (immune system). Medicines to help with lung function by reducing inflammation or scarring. Ongoing monitoring with X-rays and lab work. Oxygen therapy. Pulmonary rehabilitation. Surgery. In some cases, a lung transplant is possible. Follow these instructions at home:   Medicines Take over-the-counter and prescription medicines only as told by your health care provider. Keep your vaccinations up to date as recommended by your health care provider. General instructions Do not use any products that contain nicotine or tobacco, such as cigarettes and e-cigarettes.  If you need help quitting, ask your  health care provider. Get regular exercise, but do not overexert yourself. Ask your health care provider to suggest some activities that are safe for you to do. If you have physical limitations, you may get exercise by walking, using a stationary bike, or doing chair exercises. Ask your health care provider about using oxygen while exercising. If you are exposed to chemicals and substances at work, make sure that you wear a mask or respirator at all times. Join a pulmonary rehabilitation program or a support group for people with pulmonary fibrosis. Eat small meals often so you do not get too full. Overeating can make breathing trouble worse. Maintain a healthy weight. Lose weight if you need to. Do breathing exercises as directed by your health care provider. Keep all follow-up visits as told by your health care provider. This is important. Contact a health care provider if you: Have symptoms that do not get better with medicines. Are not able to be as active as usual. Have trouble taking a deep breath. Have a fever or chills. Have blue lips or skin. Have clubbing of your fingers. Get help right away if you: Have a sudden worsening of your symptoms. Have chest pain. Cough up mucus that is dark in color. Have a lot of headaches. Get very confused or sleepy. Summary Pulmonary fibrosis is a type of lung disease that causes scar tissue to build up in the air sacs of your lungs (alveoli) over time. Less oxygen can get into your blood. This makes it hard for you to breathe. Scarring from pulmonary fibrosis gets worse over time. This damage is permanent and may lead to other serious health problems. You are more likely to develop this condition if you have a family history of the condition or a job that exposes you to certain chemicals. There is no cure for pulmonary fibrosis. Treatment focuses on managing symptoms and preventing scarring from getting worse. This information is not intended to  replace advice given to you by your health care provider. Make sure you discuss any questions you have with your health care provider. Document Revised: 08/02/2017 Document Reviewed: 08/02/2017 Elsevier Patient Education  2022 [place] Oxygen Use, Adult When a medical condition keeps you from getting enough oxygen, your health care provider may instruct you to take extra oxygen at home. Your health care provider will let you know: When to take oxygen. How long to take oxygen. How quickly oxygen should be delivered (flow rate), in liters per minute (LPM or L/M). Home oxygen can be given through: A mask. A nasal cannula. This is a device or tube that goes in the nostrils. A transtracheal catheter. This is a small, thin tube placed in the windpipe (trachea). A breathing tube (tracheostomy tube) that is surgically placed in the windpipe. This may be used in severe cases. These devices are connected with tubing to an oxygen source, such as: A tank. Tanks hold oxygen in gas form. They must be replaced when the oxygen is used up. A liquid oxygen device. This holds oxygen in liquid form. Liquid oxygen is very cold. It must be replaced when the oxygen is used up. An oxygen concentrator machine. This filters oxygen in the room. There are two types of oxygen concentrator machines--stationary and portable. A stationary oxygen concentrator machine plugs into the main electricity supply at your home. You must have a backup cylinder of oxygen in case the power goes out. A portable oxygen  concentrator machine is smaller in size and more lightweight. This machine uses battery supply and can be used outside the home. Work with your health care provider to find equipment that works best for you and your lifestyle. What are the risks? Delivery of supplemental oxygen is generally safe. However, some risks include: Fire. This can happen if the oxygen is exposed to a heat source, flame, or spark. Injury  to skin. This can happen if liquid oxygen touches your skin. Damage to the lungs or other organs. This can happen from getting too little or too much oxygen. Supplies needed: To use oxygen, you will need: A mask, nasal cannula, transtracheal catheter, or tracheostomy. An oxygen tank, a liquid oxygen device, or an oxygen concentrator. The tape that your health care provider recommends (optional). Your health care provider may also recommend: A humidifier to warm and moisten the oxygen delivered. This will depend on how much oxygen you need and the type of home oxygen device you use. A pulse oximeter. This device measures the percentage of oxygen in your blood. How to use oxygen Your health care provider or a person from your [place] will show you how to use your oxygen device. Follow his or her instructions. The instructions may look something like this: Wash your hands with soap and water. If you use an oxygen concentrator, make sure it is plugged in. Place one end of the tube into the port on the tank, device, or machine. Place the mask over your nose and mouth. Or, place the nasal cannula and secure it with tape if instructed. If you use a tracheostomy or transtracheal catheter, connect it to the oxygen source as directed. Make sure the liter-flow setting on the machine is at the level prescribed by your health care provider. Turn on the machine or adjust the knob on the tank or device to the correct liter-flow setting. When you are done, turn off and unplug the machine, or turn the knob to OFF. How to clean and care for the oxygen supplies Nasal cannula Clean it with a warm, wet cloth daily or as needed. Wash it with a liquid soap once a week. Rinse it thoroughly once or twice a week. Air-dry it. Replace it every 2-4 weeks. If you have an infection, such as a cold or pneumonia, change the cannula when you get better. Mask Replace it every 2-4 weeks. If you have an  infection, such as a cold or pneumonia, change the mask when you get better. Humidifier bottle Wash the bottle between each refill: Wash it with soap and warm water. Rinse it thoroughly. Clean it and its top with a disinfectant cleaner. Air-dry it. Make sure it is dry before you refill it. Oxygen concentrator Clean the air filter at least twice a week according to directions from your home medical equipment and service company. Wipe down the cabinet every day. To do this: Unplug the unit. Wipe down the cabinet with a damp cloth. Dry the cabinet. Other equipment Change any extra tubing every 1-3 months. Follow instructions from your health care provider about taking care of any other equipment. Safety tips Fire safety tips  Keep your oxygen and oxygen supplies at least 6 ft (2 m) away from sources of heat, flames, and sparks at all times. Do not allow smoking near your oxygen. Put up "no smoking" signs in your home. Avoid smoking areas when in public. Do not use materials that can burn (are flammable) while you use oxygen.  This includes: Petroleum jelly. Hair spray or other aerosol sprays. Rubbing alcohol. Hand sanitizer. When you go to a restaurant with portable oxygen, ask to be seated in the non-smoking section. Keep a Data processing manager close by. Let your fire department know that you have oxygen in your home. Test your home smoke detectors regularly. Traveling Secure your oxygen tank in the vehicle so that it does not move around. Follow instructions from your medical device company about how to safely secure your tank. Make sure you have enough oxygen for the amount of time you will be away from home. If you are planning to travel by public transportation (airplane, train, bus, or boat), contact the company to find out if it allows the use of an approved portable oxygen concentrator. You may also need documents from your health care provider and medical device company before you  travel. General safety tips If you use an oxygen cylinder, make sure it is in a stand or secured to an object that will not move (fixed object). If you use liquid oxygen, make sure its container is kept upright at all times. If you use an oxygen concentrator: Tell Loss adjuster, chartered company. Make sure you are given priority service in the event that your power goes out. Avoid using extension cords if possible. Follow these instructions at home: Use oxygen only as told by your health care provider. Do not use alcohol or other drugs that make you relax (sedating drugs) unless instructed. They can slow down your breathing rate and make it hard to get in enough oxygen. Know how and when to order a refill of oxygen. Always keep a spare tank of oxygen. Plan ahead for holidays when you may not be able to get a prescription filled. Use water-based lubricants on your lips or nostrils. Do not use oil-based products like petroleum jelly. To prevent skin irritation on your cheeks or behind your ears, tuck some gauze under the tubing. Where to find more information American Lung Association: DiabeticMale.de Contact a health care provider if: You get headaches often. You have a lasting cough. You are restless or have anxiety. You develop an illness that affects your breathing. You cannot exercise at your regular level. You have a fever. You have persistent redness under your nose. Get help right away if: You are confused. You are sleepy all the time. You have blue lips or fingernails. You have difficult or irregular breathing that is getting worse. You are struggling to breathe. These symptoms may represent a serious problem that is an emergency. Do not wait to see if the symptoms will go away. Get medical help right away. Call your local emergency services (911 in the [place] Do not drive yourself to the hospital. Summary Your health care provider or a person from your [place] will  show you how to use your oxygen device. Follow his or her instructions. If you use an oxygen concentrator, make sure it is plugged in. Make sure the liter-flow setting on the machine is at the level prescribed by your health care provider. Use oxygen only as told by your health care provider. Keep your oxygen and oxygen supplies at least 6 ft (2 m) away from sources of heat, flames, and sparks at all times. This information is not intended to replace advice given to you by your health care provider. Make sure you discuss any questions you have with your health care provider. Document Revised: 08/29/2019 Document Reviewed: 06/25/2019 Elsevier Patient Education  2022 Elsevier  Inc.

## 2021-04-30 NOTE — Telephone Encounter (Signed)
[employer] is asking about capset vs capsules. Is it IPF treatment for this pt? 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 Please call back first thing Monday morning. Routing urgent per the situation being an expedited. States VM is confidential.

## 2021-04-30 NOTE — Telephone Encounter (Signed)
Received denial for Esbriet tabs, as plan prefers Esbriet 243m Caps.  [place] ES[place]aps via CoverMyMeds. Will update once we receive a response.   Key: [id number]

## 2021-04-30 NOTE — Telephone Encounter (Signed)
New Pa request processed as denied, likely a duplicate from previous denial. Called plan and rep could not see where it had been processed.   Submitted urgent appeal with new PA Key.  Phone# [phone]

## 2021-05-03 ENCOUNTER — Other Ambulatory Visit (HOSPITAL_COMMUNITY): Payer: Self-pay

## 2021-05-03 NOTE — Telephone Encounter (Signed)
Received notification from [facility] regarding a prior authorization for ESBRIET 272m CAPS. Authorization has been APPROVED from 05/03/21 to 05/03/22.   Per test claim, copay for 30 days supply is $2,315.95

## 2021-05-03 NOTE — Telephone Encounter (Signed)
Please fax in [place] paperwork once received back from provider.

## 2021-05-03 NOTE — Telephone Encounter (Signed)
Returned call and advised original denial was for tablets but office received denial because plan preferred Capsules. 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 [employer] rep said he was already able to clarify that with the chart notes sent in and is processing expedited appeal right now. He will reach out with any additional questions.

## 2021-05-03 NOTE — Telephone Encounter (Signed)
[employer] calling  to let you know that medication has been approved any questions you can reach them @ [phone] [facility] Smoker..me Hillery Hunter

## 2021-05-04 NOTE — Telephone Encounter (Signed)
Provider portion for [facility] PAP application for Esbriet placed in Dr.Ramaswamy's folder to be signed today  Knox Saliva, PharmD, MPH, BCPS Clinical Pharmacist (Rheumatology and Pulmonology)

## 2021-05-05 DIAGNOSIS — J849 Interstitial pulmonary disease, unspecified: Secondary | ICD-10-CM | POA: Diagnosis not present

## 2021-05-05 NOTE — Telephone Encounter (Signed)
Submitted Patient Assistance Application to [place] for [place] along with provider portion, patient portion, insurance card copy, and med list. Will update patient when we receive a response.  Fax# [phone] Phone# [phone]  Knox Saliva, PharmD, MPH, BCPS Clinical Pharmacist (Rheumatology and Pulmonology)

## 2021-05-07 ENCOUNTER — Telehealth: Payer: Self-pay | Admitting: Internal Medicine

## 2021-05-07 NOTE — Telephone Encounter (Signed)
disregard

## 2021-05-08 DIAGNOSIS — J849 Interstitial pulmonary disease, unspecified: Secondary | ICD-10-CM | POA: Diagnosis not present

## 2021-05-14 NOTE — Telephone Encounter (Signed)
Reached out to [place] and confirmed that application is still in-process. Verified that all relevant documents were received successfully. Will continue to f/u.

## 2021-05-19 NOTE — Telephone Encounter (Signed)
Per [employer] portal, pt has been approved for ESBRIET patient assistance beginning 05/14/2021. Pt will remain active until therapy is discontinued, there are any significant changes to pt's health insurance or financial status, or pt no longer meets program eligibility requirements.   Called pt and verified that they were aware of approval and that shipment of medication had been arranged. Pt has no further questions or concerns at this time. Approval letter printed from portal and sent to scan center along with application.

## 2021-05-19 NOTE — Telephone Encounter (Signed)
Spoke with patient who is unsure if Esbriet shipment has been set up for delivery. Reached out to his daughter, Andrew Cain, per his request. Unable to reach her but left VM with phone number for Andrew Cain to complete welcome call and [facility] to schedule shipment  Will f/u w Andrew Cain next week  Knox Saliva, PharmD, MPH, BCPS Clinical Pharmacist (Rheumatology and Pulmonology)

## 2021-05-27 MED ORDER — PIRFENIDONE 267 MG PO TABS: ORAL_TABLET | ORAL | 0 refills | Status: AC

## 2021-05-27 NOTE — Addendum Note (Signed)
Addended by: Cassandria Anger on: 05/27/2021 02:57 PM   Modules accepted: Orders

## 2021-05-27 NOTE — Telephone Encounter (Signed)
Called patient's daughter, [name] to assess if patient received Esbriet. She states she was told it would be shipped on 05/28/21 but was not sure if that's when medication would get sent to his home or when it would leave to pharmacy. She was also not aware that patient had to be home all day and sign for package. She will call her father to ensure he remains at home all day.  Called [facility] to determine date of shipment to patient - per rep, should be arriving to his home on 05/28/21. Daughter advise to communicate with patient to stay at home all day to ensure he can sign for delivery  Added starter dosing to med list today. Future order for hepatic panel placed today  F/u scheduled with Dr. Chase Caller on 06/25/21  Knox Saliva, PharmD, MPH, BCPS Clinical Pharmacist (Rheumatology and Pulmonology)

## 2021-06-05 DIAGNOSIS — J849 Interstitial pulmonary disease, unspecified: Secondary | ICD-10-CM | POA: Diagnosis not present

## 2021-06-08 DIAGNOSIS — J849 Interstitial pulmonary disease, unspecified: Secondary | ICD-10-CM | POA: Diagnosis not present

## 2021-06-10 ENCOUNTER — Ambulatory Visit: Payer: Medicare Other | Admitting: Internal Medicine

## 2021-06-21 ENCOUNTER — Other Ambulatory Visit (INDEPENDENT_AMBULATORY_CARE_PROVIDER_SITE_OTHER): Payer: Medicare Other

## 2021-06-21 DIAGNOSIS — Z5181 Encounter for therapeutic drug level monitoring: Secondary | ICD-10-CM | POA: Diagnosis not present

## 2021-06-21 LAB — HEPATIC FUNCTION PANEL
ALT: 17 U/L (ref 0–53)
AST: 16 U/L (ref 0–37)
Albumin: 4.2 g/dL (ref 3.5–5.2)
Alkaline Phosphatase: 131 U/L — ABNORMAL HIGH (ref 39–117)
Bilirubin, Direct: 0.2 mg/dL (ref 0.0–0.3)
Total Bilirubin: 0.9 mg/dL (ref 0.2–1.2)
Total Protein: 7 g/dL (ref 6.0–8.3)

## 2021-06-25 ENCOUNTER — Telehealth: Payer: Self-pay | Admitting: Internal Medicine

## 2021-06-25 ENCOUNTER — Ambulatory Visit: Payer: Medicare Other | Admitting: Internal Medicine

## 2021-06-25 ENCOUNTER — Other Ambulatory Visit: Payer: Self-pay

## 2021-06-25 ENCOUNTER — Encounter: Payer: Self-pay | Admitting: Internal Medicine

## 2021-06-25 VITALS — BP 118/64 | HR 89 | Ht 64.0 in | Wt 147.2 lb

## 2021-06-25 DIAGNOSIS — Z5181 Encounter for therapeutic drug level monitoring: Secondary | ICD-10-CM | POA: Diagnosis not present

## 2021-06-25 DIAGNOSIS — Z23 Encounter for immunization: Secondary | ICD-10-CM | POA: Diagnosis not present

## 2021-06-25 DIAGNOSIS — R110 Nausea: Secondary | ICD-10-CM

## 2021-06-25 DIAGNOSIS — J9611 Chronic respiratory failure with hypoxia: Secondary | ICD-10-CM

## 2021-06-25 DIAGNOSIS — R634 Abnormal weight loss: Secondary | ICD-10-CM

## 2021-06-25 DIAGNOSIS — J439 Emphysema, unspecified: Secondary | ICD-10-CM | POA: Diagnosis not present

## 2021-06-25 DIAGNOSIS — J84112 Idiopathic pulmonary fibrosis: Secondary | ICD-10-CM

## 2021-06-25 DIAGNOSIS — R5383 Other fatigue: Secondary | ICD-10-CM

## 2021-06-25 MED ORDER — SPIRIVA RESPIMAT 1.25 MCG/ACT IN AERS: 1.0000 | INHALATION_SPRAY | Freq: Every day | RESPIRATORY_TRACT | 5 refills | Status: AC

## 2021-06-25 NOTE — Progress Notes (Signed)
° ° ° ° °OV 04/06/2021 ° °Subjective:  °Patient ID: Andrew Cain, male , DOB: 09/26/1945 , age 75 y.o. , MRN: [id number] , ADDRESS: [address] °[place] [address] °PCP Khan, Jaber A, MD °Patient Care Team: °Khan, Jaber A, MD as PCP - General (Family Medicine) ° °This Provider for this visit: Treatment Team:  °Attending Provider: Kerrion Kemppainen, MD ° ° ° °04/06/2021 -   °Chief Complaint  °Patient presents with  ° Consult  °  Pt is being referred for ILD evaluation and due to exertional hypoxemia.  Pt was recently diagnosed with pulmonary fibrosis about Cain couple months ago. Pt has had problems with SOB for years that is worse with exertion.  ° ° ° °HPI °Andrew Cain 75 y.o. -presents with his daughter Andrew Cain and also one Andrew Cain [for the outside records his estranged wife died from an MI in December 2021].  Consult as Cain referral from [facility] Dr. Khan and is fine to social lung disease/pulmonary fibrosis.  History is provided by the daughter and also review of the outside records.  He is Cain lifelong non-smoker.  Some 6 years ago [review the chart indicate it was 2013] he suffered Cain stroke and since then has been less active.  The daughter started noticing insidious onset of shortness of breath Cain few years ago.  This was initially put down due to physical deconditioning in the postop setting.  In the last 6 to 8 months its definitely accelerated worsening.  But overall he has been worsening in the last few years.  He is Cain nurse practitioner few months ago and was diagnosed with pneumonia with underlying pulmonary fibrosis based on the verbal history.  Cain follow-up approximately 6 weeks ago showed pulmonary fibrosis on chest x-ray.  He had exertional hypoxemia into the 80s.  He was then started on portable oxygen but advised to use it on 24/7 basis.  The daughter feels that since then he might of had slightly more worsening of shortness of breath.  Class III dyspnea  definitely.  Prior course of steroids did not help this.  Since starting oxygen is also having Cain postnasal drip and Cain mild cough.  The cough is definitely new.  Was not there before the oxygen use. ° °There is no prior history of COVID.  He is Cain farmer.  Unclear if there is any mold exposure but he was Cain tobacco farmer. ° °He is also had increased frequency of falls and this is secondary to antihypertensives but this is improved after one of the hypertensives are all of them were stopped.  He has had Cain recent cardiac stress test according to the history this was normal. ° °Outside records show echocardiogram in July 2022: Ejection fraction 60-65%.  Grade 1 diastolic dysfunction normal right ventricle and normal pulmonary artery pressures. ° °Outside labs indicate normal liver function test, hemoglobin of 14.8 g%, creatinine of 1.1 mg percent in April 2022 ° °His walking desaturation test showed Cain precipitous decline in pulse ox ° °High-resolution CT chest done 04/05/2021 and radiology report emailed by the radiologist after the patient left ° °I looked at [name]'s CT. 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  There are findings of pulmonary fibrosis and emphysema with some subpleural cysts in the upper lungs which are more thick walled, which I think is due to paraseptal emphysema with associated with fibrotic changes and not definitely honeycomb change. There is some probable early honeycomb change   of the right lower lobe.  ° °I also recommended Cain six-month follow-up for Cain small solid pulmonary nodule. ° °Best, °Leah\\ ° ° ° °04/28/2021- interim hx  °Patient presents today for 1-3 week follow-up. He was recently diagnosed with ILD. CT imaging has shown progression of fibrosis, probable UIP pattern. He was ordered for PFTs, ILD serology and given ILD questionnaire. He has been referred to pulmonary rehab.  ° °He is doing about the same today. No acute complaints except for progressive dyspnea symptoms. Accompanied by  his daughter. He is very hard of hearing. Shortness of breath began gradually and has worsened over the last 2 years.  He has difficulty keeping up with others of his age.  There is some associated wheezing with cough.  He often has to clear his throat. Wearing 2-3L pulsed oxygen today. His oxygen desaturated on ambulatory walk test today in office on 2L POC. Serology was negative, he had trace positive ANA. He is meeting with pharmacist this afternoon to discuss antifibrotics.  ° °Lab testing:  °ANA positive  °ANA titer 1:160 positive  °RF negative °Sed rate 13 °CCP <16 °Anti-DNA antibody negative °Scleroderma antibody negative °Hypersensitivity pneumonitis panel negative °Sjogren's syndrome antibods negative ° °Pulmonary function testing: °04/28/2021 -FVC 1.83 (56%), FEV1 1.59 (69%) 1.59 (69%), ratio 87, TLC 53%, DLCOunc 7.32 (35%)/ Interpretation: moderate restriction with severe diffusion defect ° °ILD QUESTIONNAIRE  °Past medical history °Positive for asthma, hernia, hypertension, thyroid disease, stroke, hypertension ° °ROS: °Positive for fatigue, joint stiffness/pain/swelling, difficulty swallowing or food getting stuck in your throat, persistent dry eyes, pain or color change, weight loss, snoring/morning headaches/excessive daytime sleepiness ° °Family history: °Positive for pulmonary fibrosis, premature graying of hair or dyskeratosis congenita  ° °Exposure history °Tobacco use: °Patient is Cain non-smoker but has had secondhand smoke exposure.  No cigar, pipe or vape use.  No marijuana use.  Cocaine use.  No IV drug use. ° °Home and Hobby details: °[place].  Age of current home 1941.  Occasional mice.  No exposure to mold or mildew exposure. No CPAP.  or nebulizer.  No Jacuzzis.  He does garden, tobacco farmer.  He has been exposed to birds or feathers-previously had chicken houses, currently only has Cain rooster that lives outside. Exposure to  cows.   ° °Occupational history °Organic-alcohol significant for gardening, farming, oblique protection, Bacot grower, GERD/poultry breeder ° °Inorganic- no significant exposures ° °Medication history: °Significant for prednisone ° ° °No Known Allergies ° ° ° °OV 06/25/2021 ° °Subjective:  °Patient ID: Andrew Cain, male , DOB: 10/21/1945 , age 75 y.o. , MRN: [id number] , ADDRESS: [address] °[place] [address] °PCP Khan, Jaber A, MD °Patient Care Team: °Khan, Jaber A, MD as PCP - General (Family Medicine) ° °This Provider for this visit: Treatment Team:  °Attending Provider: Nathania Waldman, MD ° ° ° °06/25/2021 -   °Chief Complaint  °Patient presents with  ° Follow-up  °  Pt's daughter states that pt has complained to have Cain little more SOB and also states that pt has been more confused recently.  ° °Chronic hypoxic respiratory failure on oxygen °IPF diagnosis given 04/28/2021 °Esbriet start mid-to-late November 2022 °Weight loss preceding Esbriet ° °HPI °Andrew Cain 75 y.o. -returns for follow-up.  Presents with the daughter.  Daughter gives most of the history.  Daughter reports that he continues to be on 2-4 L nasal cannula oxygen.  However she   has several concerns namely ° °Shortness of breath: She feels this is worse.  However objective symptom score shows that shortness of breath is stable although very significant.  Oxygen use is also stable.  He has never had heart catheterization.  He sees Dr. Rajan at [facility].  I believe cardiac stress test and earlier in 2022 was normal ° °Fatigue and low energy and lack of initiative: This seems to be worse particularly after starting pirfenidone.  Fatigue score appears to be worse after pirfenidone ° ° °Nausea: Started pirfenidone mid to late November 2022.  For few weeks did 1 pill 3 times daily and was tolerating it well.  For the last 1 week is done 2 pills 3 times daily.  Today supposed to go up to 3 pills 3 times daily.  Yesterday  developed nausea after the drug.  She feels this is then associated with the fatigue and the loss of initiated and motivation.  The nausea score is definitely worse. ° °Weight loss: This continues.  She says he was losing weight even before the pirfenidone.  After the pirfenidone 2 is continue to lose weight.  He is lost 11 pounds between mid October 2022 and mid December 2022.  He is now normal BMI of 25. ° ° ° ° ° °Shortness of breath score 04/28/2021 ° °0-5 scale with 5 being worst °158# 06/25/2021 °Esbriet since mid-late nov 2022 °147#  °At rest 4 5  °Simple tasks-shower, clothes change, eating, shaving 6 5  °Housework (dishes, doing bed, laundry, vacuuming) 6 6  °Shopping 6 6  °Walking level at own pace 6 5  °Walking upstairs 6 5  °Total 34 32  ° °Cough 2.5 2  °Fatigue 5 4  °Appetite  4   °Nausea  2 3  °Vomiting 0 0  °Diarrhea  0 0  °Anxiety 2 3  °Depression 0 3  °Chronic pain , if so how bad  0 x  ° ° ° °Simple office walk 185 feet x  3 laps goal with forehead probe 04/06/2021 ° 04/28/2021 °  °O2 used ra 2L POC  °Number laps completed Half of 3   °Comments about pace Slow pace and wobbly Slow pace   °Resting Pulse Ox/HR 95% and 77/min 95% and 86/min  °Final Pulse Ox/HR 77% and 108/min 86% and 122/min  °Desaturated </= 88% yes Yes  °Desaturated <= 3% points yes Yes   °Got Tachycardic >/= 90/min yes Yes  °Symptoms at end of test Dyspnea, half lap wa down to 80s and upon return to room was 77%   °Miscellaneous comments Corrected with 2L [place] Corrected with 3L POC  ° ° ° °CT Chest data ° °No results found. ° ° ° °PFT ° °PFT Results Latest Ref Rng & Units 04/28/2021  °FVC-Pre L 1.73  °FVC-Predicted Pre % 53  °FVC-Post L 1.83  °FVC-Predicted Post % 56  °Pre FEV1/FVC % % 88  °Post FEV1/FCV % % 87  °FEV1-Pre L 1.52  °FEV1-Predicted Pre % 66  °FEV1-Post L 1.59  °DLCO uncorrected ml/min/mmHg 7.24  °DLCO UNC% % 35  °DLCO corrected ml/min/mmHg 7.32  °DLCO COR %Predicted % 35  °DLVA Predicted % 85  °TLC L 3.15  °TLC %  Predicted % 53  °RV % Predicted % 6  ° ° ° ° ° has Cain past medical history of Benign essential hypertension, Benign prostatic hyperplasia with lower urinary tract symptoms, Cerebral arteriosclerosis with history of previous stroke, Cerebrovascular accident (CVA) (HCC) (01/22/2015), Gout, Hyperlipidemia, Hypothyroidism (  acquired), Memory disturbance, Pure hypercholesterolemia, Thyroid disorder, and Vitamin B12 deficiency. ° ° reports that he has never smoked. He has never used smokeless tobacco. ° °Past Surgical History:  °Procedure Laterality Date  ° FRACTURE SURGERY    ° Arm  ° HERNIA REPAIR    ° ROTATOR CUFF REPAIR Left   ° ° °No Known Allergies ° °Immunization History  °Administered Date(s) Administered  ° Fluad Quad(high Dose 65+) 06/25/2021  ° Moderna Sars-Covid-2 Vaccination 09/05/2019, 10/07/2019, 07/17/2020  ° ° °Family History  °Problem Relation Age of Onset  ° Aneurysm Father   °     thoracic  ° Other Sister   °     Auto accident  ° Renal Disease Brother   ° Prostate cancer Brother   ° Prostate cancer Brother   ° Hypertension Sister   ° Hypercholesterolemia Sister   ° Liver disease Son   ° ° ° °Current Outpatient Medications:  °  albuterol (VENTOLIN HFA) 108 (90 Base) MCG/ACT inhaler, Inhale into the lungs., Disp: , Rfl:  °  allopurinol (ZYLOPRIM) 300 MG tablet, Take 300 mg by mouth daily., Disp: , Rfl:  °  aspirin EC 325 MG tablet, Take 325 mg by mouth daily., Disp: , Rfl:  °  atorvastatin (LIPITOR) 20 MG tablet, Take 20 mg by mouth daily., Disp: , Rfl:  °  cyanocobalamin (,VITAMIN B-12,) 1000 MCG/ML injection, Inject 1,000 mcg into the muscle every 30 (thirty) days., Disp: , Rfl:  °  finasteride (PROSCAR) 5 MG tablet, Take 5 mg by mouth daily., Disp: , Rfl:  °  FLUoxetine (PROZAC) 20 MG capsule, Take 20 mg by mouth daily., Disp: , Rfl:  °  levothyroxine (SYNTHROID) 125 MCG tablet, Take 125 mcg by mouth daily before breakfast., Disp: , Rfl:  °  midodrine (PROAMATINE) 10 MG tablet, Take by mouth., Disp: ,  Rfl:  °  Pirfenidone (ESBRIET) 267 MG TABS, Take 1 tab three times daily for 7 days, then 2 tabs three times daily for 7 days, then 3 tabs three times daily thereafter., Disp: 207 tablet, Rfl: 0 °  tamsulosin (FLOMAX) 0.4 MG CAPS capsule, Take 0.4 mg by mouth at bedtime., Disp: , Rfl:  °  Tiotropium Bromide Monohydrate (SPIRIVA RESPIMAT) 1.25 MCG/ACT AERS, Inhale 1 puff into the lungs daily., Disp: 4 g, Rfl: 5 ° ° °   °Objective:  ° °Vitals:  ° 06/25/21 0915  °BP: 118/64  °Pulse: 89  °SpO2: 91%  °Weight: 147 lb 3.2 oz (66.8 kg)  °Height: 5' 4" (1.626 m)  ° ° °Estimated body mass index is 25.27 kg/m² as calculated from the following: °  Height as of this encounter: 5' 4" (1.626 m). °  Weight as of this encounter: 147 lb 3.2 oz (66.8 kg). ° °@WEIGHTCHANGE@ ° °Filed Weights  ° 06/25/21 0915  °Weight: 147 lb 3.2 oz (66.8 kg)  ° ° ° Physical Exam ° ° ° °General: No distress. Looks well. On o2 °Neuro: Alert and Oriented x 3. GCS 15. Speech normal °Psych: Pleasant °Resp:  Barrel Chest - no.  Wheeze - no, Crackles - yes, No overt respiratory distress °CVS: Normal heart sounds. Murmurs - no °Ext: Stigmata of Connective Tissue Disease - no °HEENT: Normal upper airway. PEERL +. No post nasal drip ° ° ° ° °   °Assessment:  °   °  ICD-10-CM   °1. Chronic respiratory failure with hypoxia (HCC)  J96.11   °  °2. IPF (idiopathic pulmonary fibrosis) (HCC)  J84.112 Ambulatory referral   to Cardiology  °  Pulmonary function test  °  CANCELED: Hepatic function panel  °  °3. Pulmonary emphysema, unspecified emphysema type (HCC)  J43.9   °  °4. Medication monitoring encounter  Z51.81 CANCELED: Hepatic function panel  °  °5. Weight loss, non-intentional  R63.4   °  °6. Nausea  R11.0   °  °7. Other fatigue  R53.83   °  °8. Need for immunization against influenza  Z23 Flu Vaccine QUAD High Dose(Fluad)  °  ° ° °   °Plan:  °   °Patient Instructions  °Chronic respiratory failure with hypoxia (HCC) °IPF (idiopathic pulmonary fibrosis)  (HCC) °Pulmonary emphysema, unspecified emphysema type (HCC) ° °-Disease could be getting worse faster than we realize.  There is also associated emphysema that has not yet treated.  You could also have pulmonary hypertension that requires attention ° °Plan °- Start Spiriva Respimat 2 puffs once daily with albuterol as needed for the emphysema °-Continue pirfenidone at 2 pills 3 times daily [we are not escalating due to side effects below] °-Continue oxygen 2-4 L nasal cannula [pulse ox goal greater than 88%] °- Refer Dr. Dalton McLean or Dr. Daniel Bensimhon for right heart catheterization to rule out pulmonary hypertension °-Do repeat spirometry and DLCO end of January 2023/early February 2023 ° °Medication monitoring encounter °Weight loss, non-intentional °Nausea °Other fatigue ° °-Weight loss started even before starting pirfenidone in mid November 2022 but could be getting worse after the pirfenidone °-Nausea and fatigue very likely related to pirfenidone ° °Plan °- No further escalation of pirfenidone for the current time being ° -Continue 2 pills 3 times daily spacing at least 5 or 6 hours between doses ° -Zofran 4 mg 3 times daily as needed -to take at the time of taking pirfenidone °-Check liver function test today 06/25/2021 °-Monitor symptoms at home °-Continue to monitor weight ° ° °Flu vaccine need ° °Plan °  - high dose flu shot 06/25/2021 ° ° °Follow-up °- End of January 2023/early February 2023 for face-to-face visit Dr. Alisse Tuite but after completing breathing test ° -ILD symptom score and simple walking desaturation test on 3 L at the time of follow-up °-Call or return sooner if side effects from the medicines get worse ° -Might have to stop pirfenidone if side effects  symptoms are getting worse ° ° °( Level 05 visit: Estb 40-54 min   in  visit type: on-site physical face to visit  in total care time and counseling or/and coordination of care by this undersigned MD - Dr Lanyla Costello. This  includes one or more of the following on this same day 06/25/2021: pre-charting, chart review, note writing, documentation discussion of test results, diagnostic or treatment recommendations, prognosis, risks and benefits of management options, instructions, education, compliance or risk-factor reduction. It excludes time spent by the CMA or office staff in the care of the patient. Actual time 41 min) ° ° °SIGNATURE  ° ° °Dr. Davion Meara, M.D., F.C.C.P,  °Pulmonary and Critical Care Medicine °Staff Physician, [facility] °Center Director - Interstitial Lung Disease  Program  °[facility] °[place], [place], [address] ° °Pager: [phone], If no answer or between  15:00h - 7:00h: call [phone] °Telephone: [phone] ° °12:24 PM °06/25/2021 ° °

## 2021-06-25 NOTE — Telephone Encounter (Signed)
Andrew Cain -daughter was complaining that he was losing weight even before his breath started.  I reviewed the medications after he left.  I noticed that he is on Synthroid  Plan - They should call primary care physician and check his TSH/free T4.  We need to make sure the Synthroid is not overactive and making him lose weight

## 2021-06-25 NOTE — Telephone Encounter (Signed)
Andrew  Colin Cain - needs RHC for rule out who-3 PAH. HAs IPF and emphysema. The daughter [name] is the one to be called to schedule  Thanks    SIGNATURE    Dr. Brand Males, M.D., F.C.C.P,  Pulmonary and Critical Care Medicine Staff Physician, [place] Director - Interstitial Lung Disease  Program  [facility] [place] [facility] [place], [place], [address]  NPI Number:  NPI #[id number]  Pager: [phone], If no answer  -> Check AMION or Try [phone] Telephone (clinical office): [phone] Telephone (research): [phone]  9:38 AM 06/25/2021

## 2021-06-25 NOTE — Patient Instructions (Addendum)
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  There is also associated emphysema that has not yet treated.  You could also have pulmonary hypertension that requires attention  Plan - Start Spiriva Respimat 2 puffs once daily with albuterol as needed for the emphysema -Continue pirfenidone at 2 pills 3 times daily [we are not escalating due to side effects below] -Continue oxygen 2-4 L nasal cannula [pulse ox goal greater than 88%] - Refer Dr. Loralie Champagne or Dr. Glori Bickers for right heart catheterization to rule out pulmonary hypertension -Do repeat spirometry and DLCO end of January 2023/early February 2023  Medication monitoring encounter Weight loss, non-intentional Nausea Other fatigue  -Weight loss started even before starting pirfenidone in mid November 2022 but could be getting worse after the pirfenidone -Nausea and fatigue very likely related to pirfenidone  Plan - No further escalation of pirfenidone for the current time being  -Continue 2 pills 3 times daily spacing at least 5 or 6 hours between doses  -Zofran 4 mg 3 times daily as needed -to take at the time of taking pirfenidone -Check liver function test today 06/25/2021 -Monitor symptoms at home -Continue to monitor weight   Flu vaccine need  Plan   - high dose flu shot 06/25/2021   Follow-up - End of January 2023/early February 2023 for face-to-face visit Dr. Chase Caller but after completing breathing test  -ILD symptom score and simple walking desaturation test on 3 L at the time of follow-up -Call or return sooner if side effects from the medicines get worse  -Might have to stop pirfenidone if side effects  symptoms are getting worse

## 2021-06-25 NOTE — H&P (View-Only) (Signed)
OV 04/06/2021  Subjective:  Patient ID: Andrew Cain, male , DOB: [birth year] , age 76 y.o. , MRN: [id number] , ADDRESS: [place] [id number] PCP Andrew Flow, MD Patient Care Team: Andrew Flow, MD as PCP - General (Family Medicine)  This Provider for this visit: Treatment Team:  Attending Provider: Brand Males, MD    04/06/2021 -   Chief Complaint  Patient presents with   Consult    Pt is being referred for ILD evaluation and due to exertional hypoxemia.  Pt was recently diagnosed with pulmonary fibrosis about a couple months ago. Pt has had problems with SOB for years that is worse with exertion.     HPI Andrew Cain 75 y.o. -presents with his daughter Andrew Cain and also one Andrew Cain the outside records his estranged wife died from an MI in 07/01/2020.  Consult as a referral from [facility] family physicians practice Andrew Cain and is fine to social lung disease/pulmonary fibrosis.  History is provided by the daughter and also review of the outside records.  He is a lifelong non-smoker.  Some 6 years ago [review the chart indicate it was 2013] he suffered a stroke and since then has been less active.  The daughter started noticing insidious onset of shortness of breath a few years ago.  This was initially put down due to physical deconditioning in the postop setting.  In the last 6 to 8 months its definitely accelerated worsening.  But overall he has been worsening in the last few years.  He is a Designer, jewellery few months ago and was diagnosed with pneumonia with underlying pulmonary fibrosis based on the verbal history.  A follow-up approximately 6 weeks ago showed pulmonary fibrosis on chest x-ray.  He had exertional hypoxemia into the 80s.  He was then started on portable oxygen but advised to use it on 24/7 basis.  The daughter feels that since then he might of had slightly more worsening of shortness of breath.  Class III dyspnea  definitely.  Prior course of steroids did not help this.  Since starting oxygen is also having a postnasal drip and a mild cough.  The cough is definitely new.  Was not there before the oxygen use.  There is no prior history of COVID.  He is a Psychologist, sport and exercise.  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  He is also had increased frequency of falls and this is secondary to antihypertensives but this is improved after one of the hypertensives are all of them were stopped.  He has had a recent cardiac stress test according to the history this was normal.  Outside records show echocardiogram in July 2022: Ejection fraction 60-65%.  Grade 1 diastolic dysfunction normal right ventricle and normal pulmonary artery pressures.  Outside labs indicate normal liver function test, hemoglobin of 14.8 g%, creatinine of 1.1 mg percent in April 2022  His walking desaturation test showed a precipitous decline in pulse ox  High-resolution CT chest done 04/05/2021 and radiology report emailed by the radiologist after the patient left  I looked at [name]'s CT. 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  There are findings of pulmonary fibrosis and emphysema with some subpleural cysts in the upper lungs which are more thick walled, which I think is due to paraseptal emphysema with associated with fibrotic changes and not definitely honeycomb change. There is some probable early honeycomb change  of the right lower lobe.   I also recommended a six-month follow-up for a small solid pulmonary nodule.  Best, Andrew Cain\\    04/28/2021- interim hx  Patient presents today for 1-3 week follow-up. He was recently diagnosed with ILD. CT imaging has shown progression of fibrosis, probable UIP pattern. He was ordered for PFTs, ILD serology and given ILD questionnaire. He has been referred to pulmonary rehab.   He is doing about the same today. No acute complaints except for progressive dyspnea symptoms. Accompanied by  his daughter. He is very hard of hearing. Shortness of breath began gradually and has worsened over the last 2 years.  He has difficulty keeping up with others of his age.  There is some associated wheezing with cough.  He often has to clear his throat. Wearing 2-3L pulsed oxygen today. His oxygen desaturated on ambulatory walk test today in office on 2L POC. Serology was negative, he had trace positive ANA. He is meeting with pharmacist this afternoon to discuss antifibrotics.   Lab testing:  ANA positive  ANA titer 1:160 positive  RF negative Sed rate 13 CCP <16 Anti-DNA antibody negative Scleroderma antibody negative Hypersensitivity pneumonitis panel negative Sjogren's syndrome antibods negative  Pulmonary function testing: 04/28/2021 -FVC 1.83 (56%), FEV1 1.59 (69%) 1.59 (69%), ratio 87, TLC 53%, DLCOunc 7.32 (35%)/ Interpretation: moderate restriction with severe diffusion defect  ILD QUESTIONNAIRE  Past medical history Positive for asthma, hernia, hypertension, thyroid disease, stroke, hypertension  ROS: Positive for fatigue, joint stiffness/pain/swelling, difficulty swallowing or food getting stuck in your throat, persistent dry eyes, pain or color change, weight loss, snoring/morning headaches/excessive daytime sleepiness  Family history: Positive for pulmonary fibrosis, premature graying of hair or dyskeratosis congenita   Exposure history Tobacco use: Patient is a non-smoker but has had secondhand smoke exposure.  No cigar, pipe or vape use.  No marijuana use.  Cocaine use.  No IV drug use.  Home and Hobby details: [place] the [place] 20 years.  Age of current home 23.  Occasional mice.  No exposure to mold or mildew exposure. No CPAP.  or nebulizer.  No Jacuzzis.  He does garden, tobacco farmer.  He has been exposed to birds or feathers-previously had chicken houses, currently only has a rooster that lives outside. Exposure to  cows.    Occupational history Organic-alcohol significant for gardening, farming, oblique protection, Chiropodist, GERD/poultry breeder  Inorganic- no significant exposures  Medication history: Significant for prednisone   No Known Allergies    OV 06/25/2021  Subjective:  Patient ID: LADAINIAN THERIEN, male , DOB: 01-18-1946 , age 63 y.o. , MRN: [id number] , ADDRESS: [place]  [id number] PCP Andrew Flow, MD Patient Care Team: Andrew Flow, MD as PCP - General (Family Medicine)  This Provider for this visit: Treatment Team:  Attending Provider: Brand Males, MD    06/25/2021 -   Chief Complaint  Patient presents with   Follow-up    Pt's daughter states that pt has complained to have a little more SOB and also states that pt has been more confused recently.   Chronic hypoxic respiratory failure on oxygen IPF diagnosis given 04/28/2021 Esbriet start mid-to-late November 2022 Weight loss preceding Esbriet  HPI Andrew Cain 75 y.o. -returns for follow-up.  Presents with the daughter.  Daughter gives most of the history.  Daughter reports that he continues to be on 2-4 L nasal cannula oxygen.  However she  has several concerns namely  Shortness of breath: She feels this is worse.  However objective symptom score shows that shortness of breath is stable although very significant.  Oxygen use is also stable.  He has never had heart catheterization.  He sees Dr. Sunny Schlein at [facility].  I believe cardiac stress test and earlier in 2022 was normal  Fatigue and low energy and lack of initiative: This seems to be worse particularly after starting pirfenidone.  Fatigue score appears to be worse after pirfenidone   Nausea: Started pirfenidone mid to late November 2022.  For few weeks did 1 pill 3 times daily and was tolerating it well.  For the last 1 week is done 2 pills 3 times daily.  Today supposed to go up to 3 pills 3 times daily.  Yesterday  developed nausea after the drug.  She feels this is then associated with the fatigue and the loss of initiated and motivation.  The nausea score is definitely worse.  Weight loss: This continues.  She says he was losing weight even before the pirfenidone.  After the pirfenidone 2 is continue to lose weight.  He is lost 11 pounds between mid October 2022 and mid December 2022.  He is now normal BMI of 25.      Shortness of breath score 04/28/2021  0-5 scale with 5 being worst 158# 06/25/2021 Esbriet since mid-late nov 2022 147#  At rest 4 5  Simple tasks-shower, clothes change, eating, shaving 6 5  Housework (dishes, doing bed, laundry, vacuuming) 6 6  Shopping 6 6  Walking level at own pace 6 5  Walking upstairs 6 5  Total 34 32   Cough 2.5 2  Fatigue 5 4  Appetite  4   Nausea  2 3  Vomiting 0 0  Diarrhea  0 0  Anxiety 2 3  Depression 0 3  Chronic pain , if so how bad  0 x     Simple office walk 185 feet x  3 laps goal with forehead probe 04/06/2021  04/28/2021   O2 used ra 2L POC  Number laps completed Half of 3   Comments about pace Slow pace and wobbly Slow pace   Resting Pulse Ox/HR 95% and 77/min 95% and 86/min  Final Pulse Ox/HR 77% and 108/min 86% and 122/min  Desaturated </= 88% yes Yes  Desaturated <= 3% points yes Yes   Got Tachycardic >/= 90/min yes Yes  Symptoms at end of test Dyspnea, half lap wa down to 80s and upon return to room was 77%   Miscellaneous comments Corrected with 2L [place] Corrected with 3L POC     CT Chest data  No results found.    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   reports that he has never smoked. He has never used smokeless tobacco.  Past Surgical History:  Procedure Laterality Date   FRACTURE SURGERY     Arm   HERNIA REPAIR     ROTATOR CUFF REPAIR Left     No Known Allergies  Immunization History  Administered Date(s) Administered   Fluad Quad(high Dose 65+) 06/25/2021   Moderna Sars-Covid-2 Vaccination 09/05/2019, 10/07/2019, 07/17/2020    Family History  Problem Relation Age of Onset   Aneurysm Father        thoracic   Other Sister        Auto accident   Renal Disease Brother    Prostate cancer Brother    Prostate cancer Brother    Hypertension Sister    Hypercholesterolemia Sister    Liver disease Son      Current Outpatient Medications:    albuterol (VENTOLIN HFA) 108 (90 Base) MCG/ACT inhaler, Inhale into the lungs., Disp: , Rfl:    allopurinol (ZYLOPRIM) 300 MG tablet, Take 300 mg by mouth daily., Disp: , Rfl:    aspirin EC 325 MG tablet, Take 325 mg by mouth daily., Disp: , Rfl:    atorvastatin (LIPITOR) 20 MG tablet, Take 20 mg by mouth daily., Disp: , Rfl:    cyanocobalamin (,VITAMIN B-12,) 1000 MCG/ML injection, Inject 1,000 mcg into the muscle every 30 (thirty) days., Disp: , Rfl:    finasteride (PROSCAR) 5 MG tablet, Take 5 mg by mouth daily., Disp: , Rfl:    FLUoxetine (PROZAC) 20 MG capsule, Take 20 mg by mouth daily., Disp: , Rfl:    levothyroxine (SYNTHROID) 125 MCG tablet, Take 125 mcg by mouth daily before breakfast., Disp: , Rfl:    midodrine (PROAMATINE) 10 MG tablet, Take by mouth., Disp: ,  Rfl:    Pirfenidone (ESBRIET) 267 MG TABS, Take 1 tab three times daily for 7 days, then 2 tabs three times daily for 7 days, then 3 tabs three times daily thereafter., Disp: 207 tablet, Rfl: 0   tamsulosin (FLOMAX) 0.4 MG CAPS capsule, Take 0.4 mg by mouth at bedtime., Disp: , Rfl:    Tiotropium Bromide Monohydrate (SPIRIVA RESPIMAT) 1.25 MCG/ACT AERS, Inhale 1 puff into the lungs daily., Disp: 4 g, Rfl: 5      Objective:   Vitals:   06/25/21 0915  BP: 118/64  Pulse: 89  SpO2: 91%  Weight: 147 lb 3.2 oz (66.8 kg)  Height: _0  (1.626 m)    Estimated body mass index is 25.27 kg/m as calculated from the following:   Height as of this encounter: _1  (1.626 m).   Weight as of this encounter: 147 lb 3.2 oz (66.8 kg).  _2 @  Filed Weights   06/25/21 0915  Weight: 147 lb 3.2 oz (66.8 kg)     Physical Exam    General: No distress. Looks well. On o2 Neuro: Alert and Oriented x 3. GCS 15. Speech normal Psych: Pleasant Resp:  Barrel Chest - no.  Wheeze - no, Crackles - yes, No overt respiratory distress CVS: Normal heart sounds. Murmurs - no Ext: Stigmata of Connective Tissue Disease - no HEENT: Normal upper airway. PEERL +. No post nasal drip        Assessment:       ICD-10-CM   1. Chronic respiratory failure with hypoxia (HCC)  J96.11     2. IPF (idiopathic pulmonary fibrosis) ([place])  J84.112 Ambulatory referral  to Cardiology    Pulmonary function test    CANCELED: Hepatic function panel    3. Pulmonary emphysema, unspecified emphysema type ([place])  J43.9     4. Medication monitoring encounter  Z51.81 CANCELED: Hepatic function panel    5. Weight loss, non-intentional  R63.4     6. Nausea  R11.0     7. Other fatigue  R53.83     8. Need for immunization against influenza  Z23 Flu Vaccine QUAD High Dose(Fluad)         Plan:     Patient Instructions  Chronic respiratory failure with hypoxia (HCC) IPF (idiopathic pulmonary fibrosis)  (HCC) Pulmonary emphysema, unspecified emphysema type ([place])  -Disease could be getting worse faster than we realize.  There is also associated emphysema that has not yet treated.  You could also have pulmonary hypertension that requires attention  Plan - Start Spiriva Respimat 2 puffs once daily with albuterol as needed for the emphysema -Continue pirfenidone at 2 pills 3 times daily [we are not escalating due to side effects below] -Continue oxygen 2-4 L nasal cannula [pulse ox goal greater than 88%] - Refer Dr. Loralie Champagne or Dr. Glori Bickers for right heart catheterization to rule out pulmonary hypertension -Do repeat spirometry and DLCO end of January 2023/early February 2023  Medication monitoring encounter Weight loss, non-intentional Nausea Other fatigue  -Weight loss started even before starting pirfenidone in mid November 2022 but could be getting worse after the pirfenidone -Nausea and fatigue very likely related to pirfenidone  Plan - No further escalation of pirfenidone for the current time being  -Continue 2 pills 3 times daily spacing at least 5 or 6 hours between doses  -Zofran 4 mg 3 times daily as needed -to take at the time of taking pirfenidone -Check liver function test today 06/25/2021 -Monitor symptoms at home -Continue to monitor weight   Flu vaccine need  Plan   - high dose flu shot 06/25/2021   Follow-up - End of January 2023/early February 2023 for face-to-face visit Dr. Chase Caller but after completing breathing test  -ILD symptom score and simple walking desaturation test on 3 L at the time of follow-up -Call or return sooner if side effects from the medicines get worse  -Might have to stop pirfenidone if side effects  symptoms are getting worse   ( Level 05 visit: Estb 40-54 min   in  visit type: on-site physical face to visit  in total care time and counseling or/and coordination of care by this undersigned MD - Dr Andrew Cain. This  includes one or more of the following on this same day 06/25/2021: pre-charting, chart review, note writing, documentation discussion of test results, diagnostic or treatment recommendations, prognosis, risks and benefits of management options, instructions, education, compliance or risk-factor reduction. It excludes time spent by the [facility] or office staff in the care of the patient. Actual time 48 min)   SIGNATURE    Dr. Brand Cain, M.D., F.C.C.P,  Pulmonary and Critical Care Medicine Staff Physician, [facility] Director - Interstitial Lung Disease  Program  Pulmonary [place] at [place], [place], [address]  Pager: [phone], If no answer or between  15:00h - 7:00h: call [phone] Telephone: [phone]  12:24 PM 06/25/2021

## 2021-06-29 NOTE — Telephone Encounter (Signed)
Attempted to call pt's daughter [name] but unable to reach. Left message for her to return call.

## 2021-06-30 NOTE — Telephone Encounter (Signed)
Tried calling [name], pt's daughter and there was no answer- LMTCB.

## 2021-07-01 ENCOUNTER — Other Ambulatory Visit (HOSPITAL_COMMUNITY): Payer: Self-pay

## 2021-07-01 DIAGNOSIS — I272 Pulmonary hypertension, unspecified: Secondary | ICD-10-CM

## 2021-07-01 NOTE — Progress Notes (Signed)
Cath Scheduled for 07/15/2021 _0 .30pm. Daughter [name] called and information given to her about what needs to be done, will also mail out pre cath information to her as well.

## 2021-07-01 NOTE — Patient Instructions (Signed)
[facility] [place] [id number] [place] [address] Dept: [phone] Loc: [phone]  Andrew Cain  07/01/2021  You are scheduled for a Cardiac Catheterization on Thursday, January 5 with Dr. Loralie Champagne.  1. Please arrive at the [facility] (Main Entrance A) at [facility]: [address] [place],  [address] at 11:00 AM (This time is two hours before your procedure to ensure your preparation). Free valet parking service is available.   Special note: Every effort is made to have your procedure done on time. Please understand that emergencies sometimes delay scheduled procedures.  2. Diet: Do not eat solid foods after midnight.  The patient may have clear liquids until 5am upon the day of the procedure.  3. Labs: You will have blood drawn on the morning of procedure  4. Medication instructions in preparation for your procedure:   Contrast Allergy: No   On the morning of your procedure, take your Aspirin and all morning medicines. You may use sips of water.  5. Plan for one night stay--bring personal belongings. 6. Bring a current list of your medications and current insurance cards. 7. You MUST have a responsible person to drive you home. 8. Someone MUST be with you the first 24 hours after you arrive home or your discharge will be delayed. 9. Please wear clothes that are easy to get on and off and wear slip-on shoes.  Thank you for allowing [place] to care for you!   -- [facility]

## 2021-07-02 NOTE — Telephone Encounter (Signed)
Called and spoke with pt's daughter [name] letting her know the info per MR and she verbalized understanding. Nothing further needed.

## 2021-07-05 DIAGNOSIS — J849 Interstitial pulmonary disease, unspecified: Secondary | ICD-10-CM | POA: Diagnosis not present

## 2021-07-08 DIAGNOSIS — J849 Interstitial pulmonary disease, unspecified: Secondary | ICD-10-CM | POA: Diagnosis not present

## 2021-07-14 ENCOUNTER — Telehealth: Payer: Self-pay | Admitting: Internal Medicine

## 2021-07-14 NOTE — Telephone Encounter (Signed)
I called the patient daughter (DPR) and she was wanting to know if there was a benefits would outweigh the risks and I let her know that the provider would have let her know if he was not a candidate for the procedure. She feels more at ease talking to the staff before his procedure.   The daughter just wanted the provider aware that she was concerned for her dad at his age. This is an FYI to the provider. No response needed.

## 2021-07-14 NOTE — Telephone Encounter (Signed)
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  Including general guidelines about the risks of right heart catheterization.  Did explain that the cardiologists are proficient in this procedure.  At the end of the conversation she expressed understanding and desire to proceed   Will close message no need to reply

## 2021-07-15 ENCOUNTER — Ambulatory Visit (HOSPITAL_COMMUNITY)
Admission: RE | Admit: 2021-07-15 | Discharge: 2021-07-15 | Disposition: A | Discharge status: Home / Self Care | Payer: Medicare Other | Attending: Cardiology | Admitting: Cardiology

## 2021-07-15 ENCOUNTER — Other Ambulatory Visit: Payer: Self-pay

## 2021-07-15 ENCOUNTER — Encounter (HOSPITAL_COMMUNITY)
Admission: RE | Disposition: A | Discharge status: Home / Self Care | Payer: Self-pay | Source: Home / Self Care | Attending: Cardiology

## 2021-07-15 DIAGNOSIS — R110 Nausea: Secondary | ICD-10-CM | POA: Diagnosis not present

## 2021-07-15 DIAGNOSIS — R5383 Other fatigue: Secondary | ICD-10-CM | POA: Insufficient documentation

## 2021-07-15 DIAGNOSIS — J439 Emphysema, unspecified: Secondary | ICD-10-CM | POA: Diagnosis not present

## 2021-07-15 DIAGNOSIS — I272 Pulmonary hypertension, unspecified: Secondary | ICD-10-CM | POA: Diagnosis not present

## 2021-07-15 DIAGNOSIS — J84112 Idiopathic pulmonary fibrosis: Secondary | ICD-10-CM | POA: Insufficient documentation

## 2021-07-15 DIAGNOSIS — R634 Abnormal weight loss: Secondary | ICD-10-CM | POA: Diagnosis not present

## 2021-07-15 DIAGNOSIS — J9611 Chronic respiratory failure with hypoxia: Secondary | ICD-10-CM | POA: Insufficient documentation

## 2021-07-15 HISTORY — PX: RIGHT HEART CATH: CATH118263

## 2021-07-15 LAB — CBC
HCT: 43.2 % (ref 39.0–52.0)
Hemoglobin: 14.3 g/dL (ref 13.0–17.0)
MCH: 32.6 pg (ref 26.0–34.0)
MCHC: 33.1 g/dL (ref 30.0–36.0)
MCV: 98.6 fL (ref 80.0–100.0)
Platelets: 164 10*3/uL (ref 150–400)
RBC: 4.38 MIL/uL (ref 4.22–5.81)
RDW: 12.9 % (ref 11.5–15.5)
WBC: 7.1 10*3/uL (ref 4.0–10.5)
nRBC: 0 % (ref 0.0–0.2)

## 2021-07-15 LAB — POCT I-STAT EG7
Acid-Base Excess: 2 mmol/L (ref 0.0–2.0)
Acid-Base Excess: 3 mmol/L — ABNORMAL HIGH (ref 0.0–2.0)
Bicarbonate: 27.5 mmol/L (ref 20.0–28.0)
Bicarbonate: 28.6 mmol/L — ABNORMAL HIGH (ref 20.0–28.0)
Calcium, Ion: 1.17 mmol/L (ref 1.15–1.40)
Calcium, Ion: 1.26 mmol/L (ref 1.15–1.40)
HCT: 41 % (ref 39.0–52.0)
HCT: 41 % (ref 39.0–52.0)
Hemoglobin: 13.9 g/dL (ref 13.0–17.0)
Hemoglobin: 13.9 g/dL (ref 13.0–17.0)
O2 Saturation: 74 %
O2 Saturation: 77 %
Potassium: 3.7 mmol/L (ref 3.5–5.1)
Potassium: 3.9 mmol/L (ref 3.5–5.1)
Sodium: 140 mmol/L (ref 135–145)
Sodium: 141 mmol/L (ref 135–145)
TCO2: 29 mmol/L (ref 22–32)
TCO2: 30 mmol/L (ref 22–32)
pCO2, Ven: 44.7 mmHg (ref 44.0–60.0)
pCO2, Ven: 46.7 mmHg (ref 44.0–60.0)
pH, Ven: 7.394 (ref 7.250–7.430)
pH, Ven: 7.398 (ref 7.250–7.430)
pO2, Ven: 40 mmHg (ref 32.0–45.0)
pO2, Ven: 42 mmHg (ref 32.0–45.0)

## 2021-07-15 LAB — BASIC METABOLIC PANEL
Anion gap: 7 (ref 5–15)
BUN: 9 mg/dL (ref 8–23)
CO2: 27 mmol/L (ref 22–32)
Calcium: 9 mg/dL (ref 8.9–10.3)
Chloride: 102 mmol/L (ref 98–111)
Creatinine, Ser: 0.98 mg/dL (ref 0.61–1.24)
GFR, Estimated: >60 mL/min (ref >60)
Glucose, Bld: 120 mg/dL — ABNORMAL HIGH (ref 70–99)
Potassium: 3.7 mmol/L (ref 3.5–5.1)
Sodium: 136 mmol/L (ref 135–145)

## 2021-07-15 SURGERY — RIGHT HEART CATH
Anesthesia: LOCAL

## 2021-07-15 MED ORDER — MIDAZOLAM HCL 2 MG/2ML IJ SOLN
INTRAMUSCULAR | Status: AC
  Filled 2021-07-15: qty 2

## 2021-07-15 MED ORDER — ONDANSETRON HCL 4 MG/2ML IJ SOLN: 4.0000 mg | Freq: Four times a day (QID) | INTRAMUSCULAR | Status: DC | PRN

## 2021-07-15 MED ORDER — SODIUM CHLORIDE 0.9% FLUSH: 3.0000 mL | INTRAVENOUS | Status: DC | PRN

## 2021-07-15 MED ORDER — HYDRALAZINE HCL 20 MG/ML IJ SOLN: 10.0000 mg | INTRAMUSCULAR | Status: DC | PRN

## 2021-07-15 MED ORDER — LIDOCAINE HCL (PF) 1 % IJ SOLN
INTRAMUSCULAR | Status: DC | PRN
  Administered 2021-07-15: 2 mL via INTRADERMAL

## 2021-07-15 MED ORDER — SODIUM CHLORIDE 0.9 % IV SOLN: INTRAVENOUS | Status: DC

## 2021-07-15 MED ORDER — LIDOCAINE HCL (PF) 1 % IJ SOLN
INTRAMUSCULAR | Status: AC
  Filled 2021-07-15: qty 30

## 2021-07-15 MED ORDER — SODIUM CHLORIDE 0.9% FLUSH: 3.0000 mL | Freq: Two times a day (BID) | INTRAVENOUS | Status: DC

## 2021-07-15 MED ORDER — SODIUM CHLORIDE 0.9 % IV SOLN: 250.0000 mL | INTRAVENOUS | Status: DC | PRN

## 2021-07-15 MED ORDER — LABETALOL HCL 5 MG/ML IV SOLN: 10.0000 mg | INTRAVENOUS | Status: DC | PRN

## 2021-07-15 MED ORDER — ONDANSETRON HCL 4 MG/2ML IJ SOLN: 4.0000 mg | Freq: Once | INTRAMUSCULAR | Status: DC

## 2021-07-15 MED ORDER — FENTANYL CITRATE (PF) 100 MCG/2ML IJ SOLN
INTRAMUSCULAR | Status: AC
  Filled 2021-07-15: qty 2

## 2021-07-15 MED ORDER — ACETAMINOPHEN 325 MG PO TABS: 650.0000 mg | ORAL_TABLET | ORAL | Status: DC | PRN

## 2021-07-15 SURGICAL SUPPLY — 5 items
CATH BALLN WEDGE 5F 110CM (CATHETERS) ×1 IMPLANT
KIT HEART LEFT (KITS) ×2 IMPLANT
PACK CARDIAC CATHETERIZATION (CUSTOM PROCEDURE TRAY) ×2 IMPLANT
SHEATH GLIDE SLENDER 4/5FR (SHEATH) ×1 IMPLANT
TRANSDUCER W/STOPCOCK (MISCELLANEOUS) ×2 IMPLANT

## 2021-07-15 NOTE — Discharge Instructions (Signed)
Brachial Site Care   This sheet gives you information about how to care for yourself after your procedure. Your health care provider may also give you more specific instructions. If you have problems or questions, contact your health care provider. What can I expect after the procedure? After the procedure, it is common to have: Bruising and tenderness at the catheter insertion area. Follow these instructions at home: Medicines Take over-the-counter and prescription medicines only as told by your health care provider. Insertion site care Follow instructions from your health care provider about how to take care of your insertion site. Make sure you: Wash your hands with soap and water before you change your bandage (dressing). If soap and water are not available, use hand sanitizer. Remove your dressing as told by your health care provider. In 24 hours Check your insertion site every day for signs of infection. Check for: Redness, swelling, or pain. Fluid or blood. Pus or a bad smell. Warmth. Do not take baths, swim, or use a hot tub until your health care provider approves. You may shower 24-48 hours after the procedure, or as directed by your health care provider. Remove the dressing and gently wash the site with plain soap and water. Pat the area dry with a clean towel. Do not rub the site. That could cause bleeding. Do not apply powder or lotion to the site. Activity   For 24 hours after the procedure, or as directed by your health care provider: Do not flex or bend the affected arm. Do not push or pull heavy objects with the affected arm. Do not drive yourself home from the hospital or clinic. You may drive 24 hours after the procedure unless your health care provider tells you not to. Do not operate machinery or power tools. Do not lift anything that is heavier than 10 lb (4.5 kg), or the limit that you are told, until your health care provider says that it is safe.  For 2  days Ask your health care provider when it is okay to: Return to work or school. Resume usual physical activities or sports. Resume sexual activity. General instructions If the catheter site starts to bleed, raise your arm and put firm pressure on the site.  If you went home on the same day as your procedure, a responsible adult should be with you for the first 24 hours after you arrive home. Keep all follow-up visits as told by your health care provider. This is important. Contact a health care provider if: You have a fever. You have redness, swelling, or yellow drainage around your insertion site. Get help right away if: The catheter insertion area swells very fast. The insertion area is bleeding, and the bleeding does not stop when you hold steady pressure on the area. These symptoms may represent a serious problem that is an emergency. Do not wait to see if the symptoms will go away. Get medical help right away. Call your local emergency services (911 in the [place] Do not drive yourself to the hospital. Summary After the procedure, it is common to have bruising and tenderness at the site. Follow instructions from your health care provider about how to take care of your radial site wound. Check the wound every day for signs of infection. Do not lift anything that is heavier than 10 lb (4.5 kg), or the limit that you are told, until your health care provider says that it is safe. This information is not intended to replace advice  given to you by your health care provider. Make sure you discuss any questions you have with your health care provider. Document Revised: 08/02/2017 Document Reviewed: 08/02/2017 Elsevier Patient Education  2020 [employer].

## 2021-07-15 NOTE — Interval H&P Note (Signed)
History and Physical Interval Note:  07/15/2021 2:47 PM  Colin Rhein  has presented today for surgery, with the diagnosis of pulmonary HTN evaluation.  The various methods of treatment have been discussed with the patient and family. After consideration of risks, benefits and other options for treatment, the patient has consented to  Procedure(s): RIGHT HEART CATH (N/A) as a surgical intervention.  The patient's history has been reviewed, patient examined, no change in status, stable for surgery.  I have reviewed the patient's chart and labs.  Questions were answered to the patient's satisfaction.     Patrcia Schnepp [employer]

## 2021-07-16 ENCOUNTER — Encounter (HOSPITAL_COMMUNITY): Payer: Self-pay | Admitting: Cardiology

## 2021-08-05 DIAGNOSIS — J849 Interstitial pulmonary disease, unspecified: Secondary | ICD-10-CM | POA: Diagnosis not present

## 2021-08-08 DIAGNOSIS — J849 Interstitial pulmonary disease, unspecified: Secondary | ICD-10-CM | POA: Diagnosis not present

## 2021-08-16 ENCOUNTER — Ambulatory Visit: Payer: Medicare Other | Admitting: Internal Medicine

## 2021-09-05 DIAGNOSIS — J849 Interstitial pulmonary disease, unspecified: Secondary | ICD-10-CM | POA: Diagnosis not present

## 2021-09-07 DIAGNOSIS — J849 Interstitial pulmonary disease, unspecified: Secondary | ICD-10-CM | POA: Diagnosis not present

## 2021-09-14 ENCOUNTER — Other Ambulatory Visit: Payer: Self-pay

## 2021-09-14 ENCOUNTER — Encounter: Payer: Self-pay | Admitting: Internal Medicine

## 2021-09-14 ENCOUNTER — Ambulatory Visit: Payer: Medicare Other | Admitting: Internal Medicine

## 2021-09-14 ENCOUNTER — Ambulatory Visit (INDEPENDENT_AMBULATORY_CARE_PROVIDER_SITE_OTHER): Payer: Medicare Other | Admitting: Internal Medicine

## 2021-09-14 VITALS — BP 122/72 | HR 86 | Ht 64.0 in | Wt 137.0 lb

## 2021-09-14 DIAGNOSIS — R634 Abnormal weight loss: Secondary | ICD-10-CM

## 2021-09-14 DIAGNOSIS — R5383 Other fatigue: Secondary | ICD-10-CM

## 2021-09-14 DIAGNOSIS — R627 Adult failure to thrive: Secondary | ICD-10-CM

## 2021-09-14 DIAGNOSIS — J9611 Chronic respiratory failure with hypoxia: Secondary | ICD-10-CM

## 2021-09-14 DIAGNOSIS — Z5181 Encounter for therapeutic drug level monitoring: Secondary | ICD-10-CM | POA: Diagnosis not present

## 2021-09-14 DIAGNOSIS — J84112 Idiopathic pulmonary fibrosis: Secondary | ICD-10-CM

## 2021-09-14 DIAGNOSIS — Z66 Do not resuscitate: Secondary | ICD-10-CM

## 2021-09-14 DIAGNOSIS — Z7189 Other specified counseling: Secondary | ICD-10-CM

## 2021-09-14 LAB — PULMONARY FUNCTION TEST
DL/VA % pred: 78 %
DL/VA: 3.2 ml/min/mmHg/L
DLCO cor % pred: 51 %
DLCO cor: 10.53 ml/min/mmHg
DLCO unc % pred: 51 %
DLCO unc: 10.53 ml/min/mmHg

## 2021-09-14 LAB — CBC WITH DIFFERENTIAL/PLATELET
Basophils Absolute: 0 10*3/uL (ref 0.0–0.1)
Basophils Relative: 0.8 % (ref 0.0–3.0)
Eosinophils Absolute: 0.1 10*3/uL (ref 0.0–0.7)
Eosinophils Relative: 2.4 % (ref 0.0–5.0)
HCT: 41.5 % (ref 39.0–52.0)
Hemoglobin: 13.8 g/dL (ref 13.0–17.0)
Lymphocytes Relative: 19.9 % (ref 12.0–46.0)
Lymphs Abs: 1.2 10*3/uL (ref 0.7–4.0)
MCHC: 33.3 g/dL (ref 30.0–36.0)
MCV: 97.8 fl (ref 78.0–100.0)
Monocytes Absolute: 0.4 10*3/uL (ref 0.1–1.0)
Monocytes Relative: 7.1 % (ref 3.0–12.0)
Neutro Abs: 4.2 10*3/uL (ref 1.4–7.7)
Neutrophils Relative %: 69.8 % (ref 43.0–77.0)
Platelets: 178 10*3/uL (ref 150.0–400.0)
RBC: 4.25 Mil/uL (ref 4.22–5.81)
RDW: 14.4 % (ref 11.5–15.5)
WBC: 6 10*3/uL (ref 4.0–10.5)

## 2021-09-14 LAB — HEPATIC FUNCTION PANEL
ALT: 15 U/L (ref 0–53)
AST: 17 U/L (ref 0–37)
Albumin: 4.3 g/dL (ref 3.5–5.2)
Alkaline Phosphatase: 152 U/L — ABNORMAL HIGH (ref 39–117)
Bilirubin, Direct: 0.2 mg/dL (ref 0.0–0.3)
Total Bilirubin: 0.8 mg/dL (ref 0.2–1.2)
Total Protein: 7.1 g/dL (ref 6.0–8.3)

## 2021-09-14 LAB — BASIC METABOLIC PANEL
BUN: 11 mg/dL (ref 6–23)
CO2: 32 mEq/L (ref 19–32)
Calcium: 10 mg/dL (ref 8.4–10.5)
Chloride: 100 mEq/L (ref 96–112)
Creatinine, Ser: 1.02 mg/dL (ref 0.40–1.50)
GFR: 71.37 mL/min (ref >60.00)
Glucose, Bld: 88 mg/dL (ref 70–99)
Potassium: 4.2 mEq/L (ref 3.5–5.1)
Sodium: 139 mEq/L (ref 135–145)

## 2021-09-14 NOTE — Progress Notes (Signed)
° ° ° ° °OV 04/06/2021 ° °Subjective:  °Patient ID: Andrew Cain, male , DOB: 11/15/1945 , age 76 y.o. , MRN: [id number] , ADDRESS: [address] °[place] [address] °PCP Khan, Jaber A, MD °Patient Care Team: °Khan, Jaber A, MD as PCP - General (Family Medicine) ° °This Provider for this visit: Treatment Team:  °Attending Provider: Rosendo Couser, MD ° ° ° °04/06/2021 -   °Chief Complaint  °Patient presents with  ° Consult  °  Pt is being referred for ILD evaluation and due to exertional hypoxemia.  Pt was recently diagnosed with pulmonary fibrosis about a couple months ago. Pt has had problems with SOB for years that is worse with exertion.  ° ° ° °HPI °Andrew Cain 76 y.o. -presents with his daughter [name] and also one [name] [for the outside records his estranged wife died from an MI in December 2021].  Consult as a referral from [facility] Dr. Khan and is fine to social lung disease/pulmonary fibrosis.  History is provided by the daughter and also review of the outside records.  He is a lifelong non-smoker.  Some 6 years ago [review the chart indicate it was 2013] he suffered a stroke and since then has been less active.  The daughter started noticing insidious onset of shortness of breath a few years ago.  This was initially put down due to physical deconditioning in the postop setting.  In the last 6 to 8 months its definitely accelerated worsening.  But overall he has been worsening in the last few years.  He is a nurse practitioner few months ago and was diagnosed with pneumonia with underlying pulmonary fibrosis based on the verbal history.  A follow-up approximately 6 weeks ago showed pulmonary fibrosis on chest x-ray.  He had exertional hypoxemia into the 80s.  He was then started on portable oxygen but advised to use it on 24/7 basis.  The daughter feels that since then he might of had slightly more worsening of shortness of breath.  Class III dyspnea  definitely.  Prior course of steroids did not help this.  Since starting oxygen is also having a postnasal drip and a mild cough.  The cough is definitely new.  Was not there before the oxygen use. ° °There is no prior history of COVID.  He is a farmer.  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 ° °He is also had increased frequency of falls and this is secondary to antihypertensives but this is improved after one of the hypertensives are all of them were stopped.  He has had a recent cardiac stress test according to the history this was normal. ° °Outside records show echocardiogram in July 2022: Ejection fraction 60-65%.  Grade 1 diastolic dysfunction normal right ventricle and normal pulmonary artery pressures. ° °Outside labs indicate normal liver function test, hemoglobin of 14.8 g%, creatinine of 1.1 mg percent in April 2022 ° °His walking desaturation test showed a precipitous decline in pulse ox ° °High-resolution CT chest done 04/05/2021 and radiology report emailed by the radiologist after the patient left ° °I looked at [name]'s CT. 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  There are findings of pulmonary fibrosis and emphysema with some subpleural cysts in the upper lungs which are more thick walled, which I think is due to paraseptal emphysema with associated with fibrotic changes and not definitely honeycomb change. There is some probable early honeycomb change   of the right lower lobe.  ° °I also recommended a six-month follow-up for a small solid pulmonary nodule. ° °Best, °Leah\\ ° ° ° °04/28/2021- interim hx  °Patient presents today for 1-3 week follow-up. He was recently diagnosed with ILD. CT imaging has shown progression of fibrosis, probable UIP pattern. He was ordered for PFTs, ILD serology and given ILD questionnaire. He has been referred to pulmonary rehab.  ° °He is doing about the same today. No acute complaints except for progressive dyspnea symptoms. Accompanied by  his daughter. He is very hard of hearing. Shortness of breath began gradually and has worsened over the last 2 years.  He has difficulty keeping up with others of his age.  There is some associated wheezing with cough.  He often has to clear his throat. Wearing 2-3L pulsed oxygen today. His oxygen desaturated on ambulatory walk test today in office on 2L POC. Serology was negative, he had trace positive ANA. He is meeting with pharmacist this afternoon to discuss antifibrotics.  ° °Lab testing:  °ANA positive  °ANA titer 1:160 positive  °RF negative °Sed rate 13 °CCP <16 °Anti-DNA antibody negative °Scleroderma antibody negative °Hypersensitivity pneumonitis panel negative °Sjogren's syndrome antibods negative ° °Pulmonary function testing: °04/28/2021 -FVC 1.83 (56%), FEV1 1.59 (69%) 1.59 (69%), ratio 87, TLC 53%, DLCOunc 7.32 (35%)/ Interpretation: moderate restriction with severe diffusion defect ° °ILD QUESTIONNAIRE  °Past medical history °Positive for asthma, hernia, hypertension, thyroid disease, stroke, hypertension ° °ROS: °Positive for fatigue, joint stiffness/pain/swelling, difficulty swallowing or food getting stuck in your throat, persistent dry eyes, pain or color change, weight loss, snoring/morning headaches/excessive daytime sleepiness ° °Family history: °Positive for pulmonary fibrosis, premature graying of hair or dyskeratosis congenita  ° °Exposure history °Tobacco use: °Patient is a non-smoker but has had secondhand smoke exposure.  No cigar, pipe or vape use.  No marijuana use.  Cocaine use.  No IV drug use. ° °Home and Hobby details: °[place] 20 [place].  Age of current home 1941.  Occasional mice.  No exposure to mold or mildew exposure. No CPAP.  or nebulizer.  No Jacuzzis.  He does garden, tobacco farmer.  He has been exposed to birds or feathers-previously had chicken houses, currently only has a rooster that lives outside. Exposure to  cows.   ° °Occupational history °Organic-alcohol significant for gardening, farming, oblique protection, Bacot grower, GERD/poultry breeder ° °Inorganic- no significant exposures ° °Medication history: °Significant for prednisone ° ° °No Known Allergies ° ° ° °OV 06/25/2021 ° °Subjective:  °Patient ID: Andrew Cain, male , DOB: 05/06/1946 , age 75 y.o. , MRN: [id number] , ADDRESS: [address] °[place] [address] °PCP Khan, Jaber A, MD °Patient Care Team: °Khan, Jaber A, MD as PCP - General (Family Medicine) ° °This Provider for this visit: Treatment Team:  °Attending Provider: Iori Gigante, MD ° ° ° °06/25/2021 -   °Chief Complaint  °Patient presents with  ° Follow-up  °  Pt's daughter states that pt has complained to have a little more SOB and also states that pt has been more confused recently.  ° °Chronic hypoxic respiratory failure on oxygen °IPF diagnosis given 04/28/2021 °Esbriet start mid-to-late November 2022 °Weight loss preceding Esbriet ° °HPI °Jef R Pinela 75 y.o. -returns for follow-up.  Presents with the daughter.  Daughter gives most of the history.  Daughter reports that he continues to be on 2-4 L nasal cannula oxygen.  However she   has several concerns namely  Shortness of breath: She feels this is worse.  However objective symptom score shows that shortness of breath is stable although very significant.  Oxygen use is also stable.  He has never had heart catheterization.  He sees Dr. Sunny Schlein at [facility].  I believe cardiac stress test and earlier in 2022 was normal  Fatigue and low energy and lack of initiative: This seems to be worse particularly after starting pirfenidone.  Fatigue score appears to be worse after pirfenidone   Nausea: Started pirfenidone mid to late November 2022.  For few weeks did 1 pill 3 times daily and was tolerating it well.  For the last 1 week is done 2 pills 3 times daily.  Today supposed to go up to 3 pills 3 times daily.  Yesterday  developed nausea after the drug.  She feels this is then associated with the fatigue and the loss of initiated and motivation.  The nausea score is definitely worse.  Weight loss: This continues.  She says he was losing weight even before the pirfenidone.  After the pirfenidone 2 is continue to lose weight.  He is lost 11 pounds between mid October 2022 and mid December 2022.  He is now normal BMI of 25.    .   Ok Edwards 09/14/2021  Subjective:  Patient ID: Andrew Cain, male , DOB: [birth year] , age 40 y.o. , MRN: [id number] , ADDRESS: [place] [id number] PCP Mateo Flow, MD Patient Care Team: Mateo Flow, MD as PCP - General (Family Medicine)  This Provider for this visit: Treatment Team:  Attending Provider: Brand Males, MD    09/14/2021 -   Chief Complaint  Patient presents with   Follow-up    PFT performed today.  Pt has been getting out of breath quicker than before.     Chronic hypoxic respiratory failure on oxygen IPF diagnosis given 04/28/2021  - HRCT Sept 2022 - Esbriet start mid-to-late November 2022 Weight loss preceding Esbriet   HPI JAMA KRICHBAUM 76 y.o. -returns for follow-up with daughter Birdena Crandall.  According to the daughter who gives most of the history he is continue to lose weight.  Weight chart shows at least a 20 pound weight loss since the fall.  His ECOG is around 3.  He spends his day sitting in the chair and doing very minimal stuff.  According to the daughter even eating makes him extremely fatigued.  He is eating but he is taking longer to eat.  Taking longer to do shower.  When he climbs stairs he is more fatigued.  He does not go out as much.  She feels he is declining.  Pulse ox 91% room air at rest.  Still using 2 L nasal cannula at home.  Did pulmonary function test but too tired to cooperate and do the full test but only the DLCO.?  Quality.  DLCO appears stable.  He did have right heart catheterization in January  2023.  I reviewed the result and it is normal.  He continues to take pirfenidone but he is only taking 2 pills 3 times daily which is a submaximal dose.  He has nausea and fatigue and weight loss with this.  We discussed goals of care.  In the fall 2022 they did a living will.  His son died from cirrhosis.  According to the daughter he is DO NOT INTUBATE and DO NOT RESUSCITATE.  She is family with hospice  and palliative care.  She is open to having home palliative care but not hospice.  Was discussed with the patient has further goals.  He does want to try other antifibrotic's and potentially participate in clinical trials if it is possible.    CT Chest data  No results found.     Shortness of breath score 04/28/2021  0-5 scale with 5 being worst 158# 06/25/2021 Esbriet since mid-late nov 2022 147# 09/14/2021 137# esbiret low dose 91% RA at rest  At rest _0 Simple tasks-shower, clothes change, eating, shaving _1 Housework (dishes, doing bed, laundry, vacuuming) _2 Shopping _3 Walking level at own pace _4 Walking upstairs _5 Total 34 32 31   Cough 2.5 2 2.5  Fatigue _6 Appetite  4    Nausea  _7 Vomiting 0 0 0  Diarrhea  0 0 0  Anxiety _8 Depression 0 3 1  Chronic pain , if so how bad  0 x x     Simple office walk 185 feet x  3 laps goal with forehead probe 04/06/2021  04/28/2021  09/14/2021 Uses 2L at home  O2 used ra 2L POC Ra 91% RA atrest  Number laps completed Half of 3    Comments about pace Slow pace and wobbly Slow pace    Resting Pulse Ox/HR 95% and 77/min 95% and 86/min   Final Pulse Ox/HR 77% and 108/min 86% and 122/min   Desaturated </= 88% yes Yes   Desaturated <= 3% points yes Yes    Got Tachycardic >/= 90/min yes Yes   Symptoms at end of test Dyspnea, half lap wa down to 80s and upon return to room was 77%    Miscellaneous comments Corrected with 2L [place] Corrected with 3L POC     PFT  PFT Results Latest Ref Rng &  Units 09/14/2021 04/28/2021  FVC-Pre L - 1.73  FVC-Predicted Pre % - 53  FVC-Post L - 1.83  FVC-Predicted Post % - 56  Pre FEV1/FVC % % - 88  Post FEV1/FCV % % - 87  FEV1-Pre L - 1.52  FEV1-Predicted Pre % - 66  FEV1-Post L - 1.59  DLCO uncorrected ml/min/mmHg 10.53 7.24  DLCO UNC% % 51 35  DLCO corrected ml/min/mmHg 10.53 7.32  DLCO COR %Predicted % 51 35  DLVA Predicted % 78 85  TLC L - 3.15  TLC % Predicted % - 53  RV % Predicted % - 6    RHC 07/15/21  Right Heart Pressures RHC Procedural Findings: Hemodynamics (mmHg) RA 0 RV 27/3 PA 29/6, mean 15 PCWP mean 1  Oxygen saturations: PA 76% AO 100%  Cardiac Output (Fick) 5.02  Cardiac Index (Fick) 2.85 PVR 2.8 WU     has a past medical history of Benign essential hypertension, Benign prostatic hyperplasia with lower urinary tract symptoms, Cerebral arteriosclerosis with history of previous stroke, Cerebrovascular accident (CVA) ([place]) (01/22/2015), Gout, Hyperlipidemia, Hypothyroidism (acquired), Memory disturbance, Pure hypercholesterolemia, Thyroid disorder, and Vitamin B12 deficiency.   reports that he has never smoked. He has never used smokeless tobacco.  Past Surgical History:  Procedure Laterality Date   FRACTURE SURGERY     Arm   HERNIA REPAIR     RIGHT HEART CATH N/A 07/15/2021   Procedure: RIGHT HEART CATH;  Surgeon: Larey Dresser, MD;  Location: [facility];  Service: Cardiovascular;  Laterality: N/A;   ROTATOR CUFF REPAIR Left     No Known Allergies  Immunization History  Administered Date(s) Administered   Fluad Quad(high Dose 65+) 06/25/2021   Moderna Sars-Covid-2 Vaccination 09/05/2019, 10/07/2019, 07/17/2020    Family History  Problem Relation Age of Onset   Aneurysm Father        thoracic   Other Sister        Auto accident   Renal Disease Brother    Prostate cancer Brother    Prostate cancer Brother    Hypertension Sister    Hypercholesterolemia Sister    Liver disease Son       Current Outpatient Medications:    albuterol (VENTOLIN HFA) 108 (90 Base) MCG/ACT inhaler, Inhale 2 puffs into the lungs every 6 (six) hours as needed for wheezing or shortness of breath., Disp: , Rfl:    allopurinol (ZYLOPRIM) 300 MG tablet, Take 300 mg by mouth daily., Disp: , Rfl:    aspirin EC 325 MG tablet, Take 325 mg by mouth daily., Disp: , Rfl:    atorvastatin (LIPITOR) 20 MG tablet, Take 20 mg by mouth daily., Disp: , Rfl:    carboxymethylcellulose (REFRESH PLUS) 0.5 % SOLN, Place 1 drop into both eyes daily as needed (dry eyes)., Disp: , Rfl:    cyanocobalamin (,VITAMIN B-12,) 1000 MCG/ML injection, Inject 1,000 mcg into the muscle every 30 (thirty) days., Disp: , Rfl:    finasteride (PROSCAR) 5 MG tablet, Take 5 mg by mouth daily., Disp: , Rfl:    FLUoxetine (PROZAC) 20 MG capsule, Take 20 mg by mouth daily., Disp: , Rfl:    levothyroxine (SYNTHROID) 125 MCG tablet, Take 125 mcg by mouth daily before breakfast., Disp: , Rfl:    midodrine (PROAMATINE) 10 MG tablet, Take 10 mg by mouth 2 (two) times daily., Disp: , Rfl:    Pirfenidone (ESBRIET) 267 MG TABS, Take 1 tab three times daily for 7 days, then 2 tabs three times daily for 7 days, then 3 tabs three times daily thereafter. (Patient taking differently: Take 534 mg by mouth in the morning, at noon, and at bedtime. in February will start taking 3 tabs three times daily thereafter.), Disp: 207 tablet, Rfl: 0   tamsulosin (FLOMAX) 0.4 MG CAPS capsule, Take 0.4 mg by mouth daily after breakfast., Disp: , Rfl:    Tiotropium Bromide Monohydrate (SPIRIVA RESPIMAT) 1.25 MCG/ACT AERS, Inhale 1 puff into the lungs daily. (Patient not taking: Reported on 09/14/2021), Disp: 4 g, Rfl: 5      Objective:   Vitals:   09/14/21 1146 09/14/21 1231  BP: 122/72   Pulse: 93 86  SpO2: 93% 91%  Weight: 137 lb (62.1 kg)   Height: _0  (1.626 m)     Estimated body mass index is 23.52 kg/m as calculated from the following:   Height as of  this encounter: _1  (1.626 m).   Weight as of this encounter: 137 lb (62.1 kg).  _2 @  Filed Weights   09/14/21 1146  Weight: 137 lb (62.1 kg)     Physical Exam General: No distress.  Slightly fatigued looking male.  Slightly deconditioned.  Sitting comfortably no distress Neuro: Alert and Oriented x 3. GCS 15. Speech normal Psych: Pleasant Resp:  Barrel Chest - no.  Wheeze - no, Crackles - no, No overt respiratory distress CVS: Normal heart sounds. Murmurs - no Ext: Stigmata of Connective Tissue Disease - no HEENT: Normal upper airway. PEERL +. No post  nasal drip        Assessment:       ICD-10-CM   1. Chronic respiratory failure with hypoxia (HCC)  J96.11 CBC with Differential/Platelet    Basic metabolic panel    Hepatic function panel    Hepatic function panel    Basic metabolic panel    CBC with Differential/Platelet    2. IPF (idiopathic pulmonary fibrosis) (HCC)  J84.112 CBC with Differential/Platelet    Basic metabolic panel    Hepatic function panel    Hepatic function panel    Basic metabolic panel    CBC with Differential/Platelet    3. Medication monitoring encounter  Z51.81 CBC with Differential/Platelet    Basic metabolic panel    Hepatic function panel    Hepatic function panel    Basic metabolic panel    CBC with Differential/Platelet    4. Weight loss, non-intentional  R63.4     5. Other fatigue  R53.83     6. Failure to thrive in adult  R62.7     7. DNR (do not resuscitate)  Z66     8. 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, unspecified emphysema type (HCC) Medication monitoring encounter Weight loss, non-intentional Goals of care   -Disease getting worse fast - Overall fatigue and having challenges doing spirometry - no evidence for pulmonary hypertension -  right heart cath JAn 2023 - Significant weight loss +; disease and esbriet contributing   Plan - Not a candidate for tyvaso due to normal right heart cath - Continue Spiriva Respimat 2 puffs once daily with albuterol as needed for the emphysema -Stop  pirfenidone  - Start ofev 138m twice daily with food   - lower dose due to age, and weight loss -Continue oxygen 2-4 L nasal cannula [pulse ox goal greater than 88%] - Check cbc, bmet liver function test today 09/14/2021 -Monitor symptoms at home -Continue to monitor weight -DNR established  - Home palliative care (not hospice) - Clinical trials depending on course  Followup  -  4-6 weeks video visit with Dr RChase Calleror APP to monitor progess     SIGNATURE    Dr. MBrand Males M.D., F.C.C.P,  Pulmonary and Critical Care Medicine Staff Physician, CMuleshoeDirector - Interstitial Lung Disease  Program  [place]ulmonary [place] [id number] Pager: [phone] If no answer or between  15:00h - 7:00h: call [phone] Telephone: [phone]  12:43 PM 09/14/2021

## 2021-09-14 NOTE — Patient Instructions (Signed)
Patient only able to perform DLCO. ?

## 2021-09-14 NOTE — Patient Instructions (Addendum)
Chronic respiratory failure with hypoxia (HCC) ?IPF (idiopathic pulmonary fibrosis) ([place]) ?Pulmonary emphysema, unspecified emphysema type ([place]) ?Medication monitoring encounter ?Weight loss, non-intentional ?Goals of care ? ? ?-Disease getting worse fast ?- Overall fatigue and having challenges doing spirometry ?- no evidence for pulmonary hypertension - right heart cath JAn 2023 ?- Significant weight loss +; disease and esbriet contributing ? ? ?Plan ?- Not a candidate for tyvaso due to normal right heart cath ?- Continue Spiriva Respimat 2 puffs once daily with albuterol as needed for the emphysema ?-Stop  pirfenidone  ?- Start ofev 169m twice daily with food ?  - lower dose due to age, and weight loss ?-Continue oxygen 2-4 L nasal cannula [pulse ox goal greater than 88%] ?- Check cbc, bmet liver function test today 09/14/2021 ?-Monitor symptoms at home ?-Continue to monitor weight ?-DNR established ? - Home palliative care (not hospice) ?- Clinical trials depending on course ? ?Followup ? -  4-6 weeks video visit with Dr RChase Calleror APP to monitor progess ? ?

## 2021-09-14 NOTE — Progress Notes (Signed)
Patient was not able to complete spirometry successfully today; DLCO performed. ?

## 2021-09-16 ENCOUNTER — Telehealth: Payer: Self-pay

## 2021-09-16 ENCOUNTER — Other Ambulatory Visit (HOSPITAL_COMMUNITY): Payer: Self-pay

## 2021-09-16 NOTE — Telephone Encounter (Signed)
Received New start paperwork for OFEV. Will update as we work through the benefits process. ? ?Submitted a Prior Authorization request to [facility] for OFEV via CoverMyMeds. Will update once we receive a response. ? ? ?Key: [id number] ?

## 2021-09-16 NOTE — Telephone Encounter (Addendum)
Received notification from [facility] regarding a prior authorization for [place]. Authorization has been APPROVED from 09/16/2021 to 09/17/2022. Approval letter sent to scan center. ? ?Per test claim, copay for 30 days supply is $3,058.75 ? ?Patient can fill through [facility]: [phone]  ? ?Authorization # [id number] ? ? ?We are still awaiting pt's portion of application as well as income documentation. ?

## 2021-09-21 DIAGNOSIS — J9610 Chronic respiratory failure, unspecified whether with hypoxia or hypercapnia: Secondary | ICD-10-CM | POA: Diagnosis not present

## 2021-09-21 DIAGNOSIS — K5909 Other constipation: Secondary | ICD-10-CM | POA: Diagnosis not present

## 2021-09-21 DIAGNOSIS — I951 Orthostatic hypotension: Secondary | ICD-10-CM | POA: Diagnosis not present

## 2021-09-21 DIAGNOSIS — J84112 Idiopathic pulmonary fibrosis: Secondary | ICD-10-CM | POA: Diagnosis not present

## 2021-10-03 DIAGNOSIS — J849 Interstitial pulmonary disease, unspecified: Secondary | ICD-10-CM | POA: Diagnosis not present

## 2021-10-06 DIAGNOSIS — J849 Interstitial pulmonary disease, unspecified: Secondary | ICD-10-CM | POA: Diagnosis not present

## 2021-10-11 NOTE — Patient Instructions (Addendum)
Chronic respiratory failure with hypoxia (HCC) ?IPF (idiopathic pulmonary fibrosis) ([place]) ?Pulmonary emphysema, unspecified emphysema type ([place]) ?Medication monitoring encounter ?Weight loss, non-intentional ?Goals of care - DNR ? ? ?-Disease getting worse  ?- Appetite improved after stopping esbriet ?- WEight loss might have stabilzied after stopping esbriet ?-But overall ECOG 4 and having failure to thrive due do disease ?- Needing 3L [place] continous ?- Understand currently DNR and you are evaluating hospice (instead of just pallaitive care) ?- Understand no decision on ofev as yet and depends on outcome of home hospice consult ? ? ?Plan ?- - Continue Spiriva Respimat 2 puffs once daily with albuterol as needed for the emphysema ?--Continue oxygen 2-4 L nasal cannula [pulse ox goal greater than 88%] ?--Monitor symptoms at home ?-Continue to monitor weight ?-DNR established ?- Home hospice consult pending in few dasy ?- decision on home PT and ofev pending home hospicce consult  ? ?Followup - 2  weeks video visit with NP to further refile goals of care esp around O[place]  9.30am on Tuesday 10/26/21  ?

## 2021-10-11 NOTE — Progress Notes (Signed)
? ? ? ?OV 04/06/2021 ? ?Subjective:  ?Patient ID: Andrew Cain, male , DOB: December 17, 1945 , age 76 y.o. , MRN: [id number] , ADDRESS: [address] ?[place] [id number] ?PCP Mateo Flow, MD ?Patient Care Team: ?Mateo Flow, MD as PCP - General (Family Medicine) ? ?This Provider for this visit: Treatment Team:  ?Attending Provider: Brand Males, MD ? ? ? ?04/06/2021 -   ?Chief Complaint  ?Patient presents with  ? Consult  ?  Pt is being referred for ILD evaluation and due to exertional hypoxemia.  Pt was recently diagnosed with pulmonary fibrosis about a couple months ago. Pt has had problems with SOB for years that is worse with exertion.  ? ? ? ?HPI ?Andrew Cain 76 y.o. -presents with his daughter Andrew Cain and also one Andrew Cain the outside records his estranged wife died from an MI in 06-29-2020.  Consult as a referral from [facility] family physicians practice Dr. Humphrey Rolls and is fine to social lung disease/pulmonary fibrosis.  History is provided by the daughter and also review of the outside records.  He is a lifelong non-smoker.  Some 6 years ago [review the chart indicate it was 2013] he suffered a stroke and since then has been less active.  The daughter started noticing insidious onset of shortness of breath a few years ago.  This was initially put down due to physical deconditioning in the postop setting.  In the last 6 to 8 months its definitely accelerated worsening.  But overall he has been worsening in the last few years.  He is a Designer, jewellery few months ago and was diagnosed with pneumonia with underlying pulmonary fibrosis based on the verbal history.  A follow-up approximately 6 weeks ago showed pulmonary fibrosis on chest x-ray.  He had exertional hypoxemia into the 80s.  He was then started on portable oxygen but advised to use it on 24/7 basis.  The daughter feels that since then he might of had slightly more worsening of shortness of breath.  Class III dyspnea  definitely.  Prior course of steroids did not help this.  Since starting oxygen is also having a postnasal drip and a mild cough.  The cough is definitely new.  Was not there before the oxygen use. ? ?There is no prior history of COVID.  He is a Psychologist, sport and exercise.  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 ? ?He is also had increased frequency of falls and this is secondary to antihypertensives but this is improved after one of the hypertensives are all of them were stopped.  He has had a recent cardiac stress test according to the history this was normal. ? ?Outside records show echocardiogram in July 2022: Ejection fraction 60-65%.  Grade 1 diastolic dysfunction normal right ventricle and normal pulmonary artery pressures. ? ?Outside labs indicate normal liver function test, hemoglobin of 14.8 g%, creatinine of 1.1 mg percent in April 2022 ? ?His walking desaturation test showed a precipitous decline in pulse ox ? ?High-resolution CT chest done 04/05/2021 and radiology report emailed by the radiologist after the patient left ? ?I looked at Mr. Brott's CT. 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  There are findings of pulmonary fibrosis and emphysema with some subpleural cysts in the upper lungs which are more thick walled, which I think is due to paraseptal emphysema with associated with fibrotic changes and not definitely honeycomb change. There is some probable early honeycomb change of  the right lower lobe.  ? ?I also recommended a six-month follow-up for a small solid pulmonary nodule. ? ?Best, ?Andrew Cain\\\ ? ? ? ?04/28/2021- interim hx  ?Patient presents today for 1-3 week follow-up. He was recently diagnosed with ILD. CT imaging has shown progression of fibrosis, probable UIP pattern. He was ordered for PFTs, ILD serology and given ILD questionnaire. He has been referred to pulmonary rehab.  ? ?He is doing about the same today. No acute complaints except for progressive dyspnea symptoms. Accompanied by  his daughter. He is very hard of hearing. Shortness of breath began gradually and has worsened over the last 2 years.  He has difficulty keeping up with others of his age.  There is some associated wheezing with cough.  He often has to clear his throat. Wearing 2-3L pulsed oxygen today. His oxygen desaturated on ambulatory walk test today in office on 2L POC. Serology was negative, he had trace positive ANA. He is meeting with pharmacist this afternoon to discuss antifibrotics.  ? ?Lab testing:  ?ANA positive  ?ANA titer 1:160 positive  ?RF negative ?Sed rate 13 ?CCP <16 ?Anti-DNA antibody negative ?Scleroderma antibody negative ?Hypersensitivity pneumonitis panel negative ?Sjogren's syndrome antibods negative ? ?Pulmonary function testing: ?04/28/2021 -FVC 1.83 (56%), FEV1 1.59 (69%) 1.59 (69%), ratio 87, TLC 53%, DLCOunc 7.32 (35%)/ Interpretation: moderate restriction with severe diffusion defect ? ?ILD QUESTIONNAIRE  ?Past medical history ?Positive for asthma, hernia, hypertension, thyroid disease, stroke, hypertension ? ?ROS: ?Positive for fatigue, joint stiffness/pain/swelling, difficulty swallowing or food getting stuck in your throat, persistent dry eyes, pain or color change, weight loss, snoring/morning headaches/excessive daytime sleepiness ? ?Family history: ?Positive for pulmonary fibrosis, premature graying of hair or dyskeratosis congenita  ? ?Exposure history ?Tobacco use: ?Patient is a non-smoker but has had secondhand smoke exposure.  No cigar, pipe or vape use.  No marijuana use.  Cocaine use.  No IV drug use. ? ?Home and Hobby details: ?[place] 20 [place].  Age of current home 74.  Occasional mice.  No exposure to mold or mildew exposure. No CPAP.  or nebulizer.  No Jacuzzis.  He does garden, tobacco farmer.  He has been exposed to birds or feathers-previously had chicken houses, currently only has a rooster that lives outside. Exposure to  cows.   ? ?Occupational history ?Organic-alcohol significant for gardening, farming, oblique protection, Chiropodist, GERD/poultry breeder ? ?Inorganic- no significant exposures ? ?Medication history: ?Significant for prednisone ? ? ?No Known Allergies ? ? ? ?OV 06/25/2021 ? ?Subjective:  ?Patient ID: Andrew Cain, male , DOB: 12-06-1945 , age 8 y.o. , MRN: [id number] , ADDRESS: [address] ?[place] [id number] ?PCP Mateo Flow, MD ?Patient Care Team: ?Mateo Flow, MD as PCP - General (Family Medicine) ? ?This Provider for this visit: Treatment Team:  ?Attending Provider: Brand Males, MD ? ? ? ?06/25/2021 -   ?Chief Complaint  ?Patient presents with  ? Follow-up  ?  Pt's daughter states that pt has complained to have a little more SOB and also states that pt has been more confused recently.  ? 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 ?HPI ?Andrew Cain 76 y.o. -returns for follow-up.  Presents with the daughter.  Daughter gives most of the history.  Daughter reports that he continues to be on 2-4 L nasal cannula oxygen.  However she has  several concerns namely ? ?Shortness of breath: She feels this is worse.  However objective symptom score shows that shortness of breath is stable although very significant.  Oxygen use is also stable.  He has never had heart catheterization.  He sees Dr. Sunny Schlein at [facility].  I believe cardiac stress test and earlier in 2022 was normal ? ?Fatigue and low energy and lack of initiative: This seems to be worse particularly after starting pirfenidone.  Fatigue score appears to be worse after pirfenidone ? ? ?Nausea: Started pirfenidone mid to late November 2022.  For few weeks did 1 pill 3 times daily and was tolerating it well.  For the last 1 week is done 2 pills 3 times daily.  Today supposed to go up to 3 pills 3 times daily.  Yesterday  developed nausea after the drug.  She feels this is then associated with the fatigue and the loss of initiated and motivation.  The nausea score is definitely worse. ? ?Weight loss: This continues.  She says he was losin

## 2021-10-12 ENCOUNTER — Encounter: Payer: Self-pay | Admitting: Internal Medicine

## 2021-10-12 ENCOUNTER — Telehealth (INDEPENDENT_AMBULATORY_CARE_PROVIDER_SITE_OTHER): Payer: Medicare Other | Admitting: Internal Medicine

## 2021-10-12 DIAGNOSIS — J9611 Chronic respiratory failure with hypoxia: Secondary | ICD-10-CM | POA: Diagnosis not present

## 2021-10-12 DIAGNOSIS — R5383 Other fatigue: Secondary | ICD-10-CM | POA: Diagnosis not present

## 2021-10-12 DIAGNOSIS — Z7189 Other specified counseling: Secondary | ICD-10-CM

## 2021-10-12 DIAGNOSIS — J84112 Idiopathic pulmonary fibrosis: Secondary | ICD-10-CM

## 2021-10-12 DIAGNOSIS — Z5181 Encounter for therapeutic drug level monitoring: Secondary | ICD-10-CM | POA: Diagnosis not present

## 2021-10-26 ENCOUNTER — Encounter: Payer: Self-pay | Admitting: Nurse Practitioner

## 2021-10-26 ENCOUNTER — Telehealth: Payer: Self-pay

## 2021-10-26 ENCOUNTER — Telehealth (INDEPENDENT_AMBULATORY_CARE_PROVIDER_SITE_OTHER): Payer: Medicare Other | Admitting: Nurse Practitioner

## 2021-10-26 DIAGNOSIS — Z7189 Other specified counseling: Secondary | ICD-10-CM

## 2021-10-26 DIAGNOSIS — J84112 Idiopathic pulmonary fibrosis: Secondary | ICD-10-CM | POA: Diagnosis not present

## 2021-10-26 NOTE — Assessment & Plan Note (Signed)
Patient and family have made shared decision to move forward with Ofev therapy due to progression of disease. We will repeat labs prior to starting. Prior authorization has been completed through [facility]. Daughter will bring their portion of the paperwork in this week to file. Message sent to pharmacy team to restart process for initiation of Ofev 1 pill Twice daily. Discussed that it is imperative they monitor his appetite, GI symptoms, and weight and notify of any negative changes as he is already frail and I do not want to negatively impact his quality of life. Will need repeat labs 4 weeks after starting.  ? ?Patient Instructions  ?- Continue Spiriva Respimat 2 puffs once daily with albuterol as needed for the emphysema ?- Start ofev 137m twice daily with food ?             - lower dose due to age, and weight loss ?             -Wear sunscreen when outside ?-Continue oxygen 2-4 L nasal cannula [pulse ox goal greater than 88%] ?- Check cbc, bmet, liver function test this week prior to starting ?-Monitor symptoms at home ?-Continue to monitor weight ?-DNR/DNI established ?- Home palliative care (not hospice) ? ? ?

## 2021-10-26 NOTE — Patient Instructions (Addendum)
-  Continue Spiriva Respimat 2 puffs once daily with albuterol as needed for the emphysema ?- Start ofev 130m twice daily with food ?             - lower dose due to age, and weight loss ?             -Wear sunscreen when outside ?-Continue oxygen 2-4 L nasal cannula [pulse ox goal greater than 88%] ?- Check cbc, bmet, liver function test this week prior to starting ?-Monitor symptoms at home ?-Continue to monitor weight ?-DNR/DNI established ?- Home palliative care (not hospice) ?

## 2021-10-26 NOTE — Progress Notes (Signed)
? ?Patient ID: Andrew Cain, male     DOB: 11-Aug-1945, 76 y.o.      MRN: [id number] ? ?Chief Complaint  ?Patient presents with  ? Follow-up  ?  2wk f/u IPF 3L o2  ? ? ?Virtual Visit via Video Note ? ?I connected with Andrew Cain on 10/26/21 at  9:30 AM EDT by a video enabled telemedicine application and verified that I am speaking with the correct person using two identifiers. ? ?Location: ?Patient: Home ?Provider: Office ?  ?I discussed the limitations of evaluation and management by telemedicine and the availability of in person appointments. The patient expressed understanding and agreed to proceed. ? ?History of Present Illness: ?76 year old male, never smoker followed for IPF and chronic respiratory failure. He is a patient of Dr. Golden Pop and last seen via virtual visit 10/12/2021. Past medical history significant for hx of CVA, HTN, HLD, BPH, depression. ? ?09/14/2021: OV with Dr. Chase Caller. Esbriet was started in mid to late Nov '22. Had had significant weight loss since the fall (approx 20 lb). Daughter felt as though he was declining. They are considering palliative/hospice care as well. He is currently a DNR/DNI.  No evidence of pulm HTN on recent right heart cath. Patient and daughter expressed desire to want to try further antifibrotics and/or clinical trials. Stopped pirfenidone. Process started to start Ofev 1 pill Twice daily. Labs checked and stable. Continue supplemental 2-4 lpm ? ?10/12/2021: Virtual visit with Dr. Chase Caller. Original intent was to determine how Ofev was going. Awaiting consult with hospice; strongly considering. Plans for visit for hospice eval in a few days. Daughter felt as though he was more short of breath. Requiring 3 lpm supplemental O2. Appetite has improved since stopping pirfenidone. Yet to start Ofev; unsure if they would like to move forward. Agreed to wait on hospice consult and follow up in 2 weeks to further discuss goals of care and possibility of starting Ofev.   ? ?10/26/2021: Today - follow up ?Patient presents today with daughter, [name], via virtual visit. They have recently started with palliative care and participating in their comfort care program. They will send a nurse out weekly to check in on him and help coordinate care between his specialists, when indicated. They have decided to hold off on hospice care at this point in time. The patient and his daughter would like to move forward with Ofev therapy. They both report that he is feeling relatively the same as the last time he was seen. Breathing is overall stable since that visit; progressively worsened over time. The daughter does feel like his cough has possibly increased, but unsure. He continues on 3 lpm supplemental O2 without increased demand. They do feel like his symptoms are related to progression of his IPF. His weights have been stable and appetite good. No hemoptysis, leg swelling, fevers, night sweats or further weight loss. Continues on Spiriva daily. Doesn't use albuterol frequently.  ? ?No Known Allergies ?Immunization History  ?Administered Date(s) Administered  ? Fluad Quad(high Dose 65+) 06/25/2021  ? Moderna Sars-Covid-2 Vaccination 09/05/2019, 10/07/2019, 07/17/2020  ? ?Past Medical History:  ?Diagnosis Date  ? Benign essential hypertension   ? Benign prostatic hyperplasia with lower urinary tract symptoms   ? Cerebral arteriosclerosis with history of previous stroke   ? Cerebrovascular accident (CVA) ([facility]) 01/22/2015  ? Gout   ? Hyperlipidemia   ? Hypothyroidism (acquired)   ? Memory disturbance   ? Pure hypercholesterolemia   ? Thyroid disorder   ?  Vitamin B12 deficiency   ? ? ?Tobacco History: ?Social History  ? ?Tobacco Use  ?Smoking Status Never  ?Smokeless Tobacco Never  ? ?Counseling given: Not Answered ? ? ?Outpatient Medications Prior to Visit  ?Medication Sig Dispense Refill  ? albuterol (VENTOLIN HFA) 108 (90 Base) MCG/ACT inhaler Inhale 2 puffs into the lungs every 6 (six) hours as  needed for wheezing or shortness of breath.    ? allopurinol (ZYLOPRIM) 300 MG tablet Take 300 mg by mouth daily.    ? aspirin EC 325 MG tablet Take 325 mg by mouth daily.    ? atorvastatin (LIPITOR) 20 MG tablet Take 20 mg by mouth daily.    ? carboxymethylcellulose (REFRESH PLUS) 0.5 % SOLN Place 1 drop into both eyes daily as needed (dry eyes).    ? cyanocobalamin (,VITAMIN B-12,) 1000 MCG/ML injection Inject 1,000 mcg into the muscle every 30 (thirty) days.    ? finasteride (PROSCAR) 5 MG tablet Take 5 mg by mouth daily.    ? FLUoxetine (PROZAC) 20 MG capsule Take 20 mg by mouth daily.    ? levothyroxine (SYNTHROID) 125 MCG tablet Take 125 mcg by mouth daily before breakfast.    ? midodrine (PROAMATINE) 10 MG tablet Take 10 mg by mouth 2 (two) times daily.    ? tamsulosin (FLOMAX) 0.4 MG CAPS capsule Take 0.4 mg by mouth daily after breakfast.    ? Pirfenidone (ESBRIET) 267 MG TABS Take 1 tab three times daily for 7 days, then 2 tabs three times daily for 7 days, then 3 tabs three times daily thereafter. (Patient not taking: Reported on 10/26/2021) 207 tablet 0  ? Tiotropium Bromide Monohydrate (SPIRIVA RESPIMAT) 1.25 MCG/ACT AERS Inhale 1 puff into the lungs daily. (Patient not taking: Reported on 10/26/2021) 4 g 5  ? ?No facility-administered medications prior to visit.  ? ?  ?Review of Systems:  ? ?Constitutional: No weight loss or gain, night sweats, fevers, chills. +fatigue, lassitude (baseline) ?HEENT: No headaches, difficulty swallowing, tooth/dental problems, or sore throat. No sneezing, itching, ear ache, nasal congestion, or post nasal drip ?CV:  No chest pain, orthopnea, PND, swelling in lower extremities, anasarca, dizziness, palpitations, syncope ?Resp: +shortness of breath with exertion (baseline); cough (dry; possible slight increase). No excess mucus or change in color of mucus. No hemoptysis. No wheezing.  No chest wall deformity ?GI:  No heartburn, indigestion, abdominal pain, nausea, vomiting,  diarrhea, change in bowel habits, loss of appetite, bloody stools.  ?Skin: No rash, lesions, ulcerations ?MSK:  No joint pain or swelling.  No decreased range of motion.  No back pain. ?Neuro: No dizziness or lightheadedness.  ?Psych: No depression or anxiety. Mood stable.  ? ?Observations/Objective: ?Pleasant, interactive. Appears frail and deconditioned. Wearing supplemental O2 with unlabored, regular breathing. Minimal communication from patient, but daughter reports he is hard of hearing. Daughter, [name], provided most of the history today but patient does verbalize agreement with plans.  ? ?Assessment and Plan: ?IPF (idiopathic pulmonary fibrosis) ([place]) ?Patient and family have made shared decision to move forward with Ofev therapy due to progression of disease. We will repeat labs prior to starting. Prior authorization has been completed through [facility]. Daughter will bring their portion of the paperwork in this week to file. Message sent to pharmacy team to restart process for initiation of Ofev 1 pill Twice daily. Discussed that it is imperative they monitor his appetite, GI symptoms, and weight and notify of any negative changes as he is already frail and I  do not want to negatively impact his quality of life. Will need repeat labs 4 weeks after starting.  ? ?Patient Instructions  ?- Continue Spiriva Respimat 2 puffs once daily with albuterol as needed for the emphysema ?- Start ofev 113m twice daily with food ?             - lower dose due to age, and weight loss ?             -Wear sunscreen when outside ?-Continue oxygen 2-4 L nasal cannula [pulse ox goal greater than 88%] ?- Check cbc, bmet, liver function test this week prior to starting ?-Monitor symptoms at home ?-Continue to monitor weight ?-DNR/DNI established ?- Home palliative care (not hospice) ? ? ? ?Goals of care, counseling/discussion ?Currently on palliative care. Decided to hold off on hospice at this time. DNR/DNI stands. Wants to proceed  with Ofev for progression of IPF as they feel this will improve his quality of life and symptoms.  ? ?04/28/2021 PFTs: FVC 56, FEV1 69, ratio 87, TLC 53, DLCOcor 35%. Severe restrictive lung disease with

## 2021-10-26 NOTE — Assessment & Plan Note (Signed)
Currently on palliative care. Decided to hold off on hospice at this time. DNR/DNI stands. Wants to proceed with Ofev for progression of IPF as they feel this will improve his quality of life and symptoms.  ?

## 2021-10-26 NOTE — Telephone Encounter (Signed)
OFEV restart per MR note, bringing paperwork to office  ?

## 2021-10-28 NOTE — Telephone Encounter (Signed)
Per 4/18 note, pt is planning to bring his portion of the PAP application back to the clinic. Will await it's delivery. ?

## 2021-10-29 NOTE — Telephone Encounter (Signed)
Noted. F/u will occur Ofev BIV encounter ? ?Knox Saliva, PharmD, MPH, BCPS ?Clinical Pharmacist (Rheumatology and Pulmonology) ?

## 2021-11-02 DIAGNOSIS — S39012A Strain of muscle, fascia and tendon of lower back, initial encounter: Secondary | ICD-10-CM | POA: Diagnosis not present

## 2021-11-02 DIAGNOSIS — R634 Abnormal weight loss: Secondary | ICD-10-CM | POA: Diagnosis not present

## 2021-11-02 DIAGNOSIS — J84112 Idiopathic pulmonary fibrosis: Secondary | ICD-10-CM | POA: Diagnosis not present

## 2021-11-02 DIAGNOSIS — J9610 Chronic respiratory failure, unspecified whether with hypoxia or hypercapnia: Secondary | ICD-10-CM | POA: Diagnosis not present

## 2021-11-03 DIAGNOSIS — J849 Interstitial pulmonary disease, unspecified: Secondary | ICD-10-CM | POA: Diagnosis not present

## 2021-11-06 DIAGNOSIS — J849 Interstitial pulmonary disease, unspecified: Secondary | ICD-10-CM | POA: Diagnosis not present

## 2021-11-18 NOTE — Telephone Encounter (Signed)
ATC patient to determine status of Ofev [employer] pt assistance application. Left VM requesting return call for update. ? ?Knox Saliva, PharmD, MPH, BCPS, CPP ?Clinical Pharmacist (Rheumatology and Pulmonology) ?

## 2021-11-22 DIAGNOSIS — I959 Hypotension, unspecified: Secondary | ICD-10-CM | POA: Diagnosis not present

## 2021-11-22 DIAGNOSIS — J8410 Pulmonary fibrosis, unspecified: Secondary | ICD-10-CM | POA: Diagnosis not present

## 2021-11-22 DIAGNOSIS — W010XXA Fall on same level from slipping, tripping and stumbling without subsequent striking against object, initial encounter: Secondary | ICD-10-CM | POA: Diagnosis not present

## 2021-12-03 DIAGNOSIS — J849 Interstitial pulmonary disease, unspecified: Secondary | ICD-10-CM | POA: Diagnosis not present

## 2021-12-06 DIAGNOSIS — J849 Interstitial pulmonary disease, unspecified: Secondary | ICD-10-CM | POA: Diagnosis not present

## 2021-12-08 NOTE — Telephone Encounter (Signed)
Spoke with patient's daughter, [name], who states they have had hospice come to their house and palliative care as well. She states he is continuing to deteriorate. He has no appetite. He is out of breath and exhausted all of the time. He is continuing to lose weight (right now weighs 130 lbs). He is a fall risk and has fallen several times in the past month  She states he does not have Database administrator since he has just moved in with her this year. She will plan to drop off application and proof of income next week.  Patient is getting labwork completed with PCP on 12/17/21.  Knox Saliva, PharmD, MPH, BCPS, CPP Clinical Pharmacist (Rheumatology and Pulmonology)

## 2021-12-22 DIAGNOSIS — M109 Gout, unspecified: Secondary | ICD-10-CM | POA: Diagnosis not present

## 2021-12-22 DIAGNOSIS — E7800 Pure hypercholesterolemia, unspecified: Secondary | ICD-10-CM | POA: Diagnosis not present

## 2021-12-22 DIAGNOSIS — J84112 Idiopathic pulmonary fibrosis: Secondary | ICD-10-CM | POA: Diagnosis not present

## 2021-12-22 DIAGNOSIS — E039 Hypothyroidism, unspecified: Secondary | ICD-10-CM | POA: Diagnosis not present

## 2021-12-23 NOTE — Telephone Encounter (Signed)
ATC [name] regarding dropping off PAP to the clinic. Unable to leave VM as mailbox full.   Joseph Art, Pharm.D. PGY-1 Pharmacy Resident 12/23/2021 2:42 PM

## 2021-12-28 NOTE — Telephone Encounter (Signed)
ATC [name] and left a VM regarding the status of the patient assistance application. Requested a call back.  Rodolph Bong, PharmD Candidate 12/28/2021 2:41 PM

## 2022-01-03 DIAGNOSIS — J849 Interstitial pulmonary disease, unspecified: Secondary | ICD-10-CM | POA: Diagnosis not present

## 2022-06-10 DEATH — deceased
